# Patient Record
Sex: Female | Born: 1950 | Race: White | Hispanic: No | Smoking: Never smoker
Health system: Southern US, Community
[De-identification: ages and names within clinical notes are randomized; demographics above are authoritative.]

## PROBLEM LIST (undated history)

## (undated) DIAGNOSIS — M1990 Unspecified osteoarthritis, unspecified site: Secondary | ICD-10-CM

## (undated) DIAGNOSIS — R569 Unspecified convulsions: Secondary | ICD-10-CM

## (undated) DIAGNOSIS — Z923 Personal history of irradiation: Secondary | ICD-10-CM

## (undated) DIAGNOSIS — Z8042 Family history of malignant neoplasm of prostate: Secondary | ICD-10-CM

## (undated) DIAGNOSIS — C801 Malignant (primary) neoplasm, unspecified: Secondary | ICD-10-CM

## (undated) DIAGNOSIS — Z800 Family history of malignant neoplasm of digestive organs: Secondary | ICD-10-CM

## (undated) DIAGNOSIS — H269 Unspecified cataract: Secondary | ICD-10-CM

## (undated) DIAGNOSIS — Z803 Family history of malignant neoplasm of breast: Secondary | ICD-10-CM

## (undated) DIAGNOSIS — Z862 Personal history of diseases of the blood and blood-forming organs and certain disorders involving the immune mechanism: Secondary | ICD-10-CM

## (undated) DIAGNOSIS — C50919 Malignant neoplasm of unspecified site of unspecified female breast: Secondary | ICD-10-CM

## (undated) DIAGNOSIS — D649 Anemia, unspecified: Secondary | ICD-10-CM

## (undated) DIAGNOSIS — E7800 Pure hypercholesterolemia, unspecified: Secondary | ICD-10-CM

## (undated) HISTORY — DX: Pure hypercholesterolemia, unspecified: E78.00

## (undated) HISTORY — DX: Unspecified convulsions: R56.9

## (undated) HISTORY — DX: Family history of malignant neoplasm of digestive organs: Z80.0

## (undated) HISTORY — DX: Family history of malignant neoplasm of breast: Z80.3

## (undated) HISTORY — DX: Personal history of diseases of the blood and blood-forming organs and certain disorders involving the immune mechanism: Z86.2

## (undated) HISTORY — DX: Unspecified osteoarthritis, unspecified site: M19.90

## (undated) HISTORY — DX: Family history of malignant neoplasm of prostate: Z80.42

## (undated) HISTORY — DX: Unspecified cataract: H26.9

## (undated) HISTORY — DX: Anemia, unspecified: D64.9

## (undated) HISTORY — PX: BREAST BIOPSY: SHX20

## (undated) HISTORY — PX: OTHER SURGICAL HISTORY: SHX169

---
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
## 2003-02-28 ENCOUNTER — Ambulatory Visit (HOSPITAL_COMMUNITY): Admission: RE | Admit: 2003-02-28 | Discharge: 2003-02-28 | Payer: Self-pay | Admitting: Family Medicine

## 2006-09-09 ENCOUNTER — Emergency Department (HOSPITAL_COMMUNITY): Admission: EM | Admit: 2006-09-09 | Discharge: 2006-09-09 | Payer: Self-pay | Admitting: Family Medicine

## 2006-10-20 ENCOUNTER — Ambulatory Visit: Payer: Self-pay | Admitting: Gastroenterology

## 2006-11-03 ENCOUNTER — Encounter: Payer: Self-pay | Admitting: Gastroenterology

## 2006-11-03 ENCOUNTER — Ambulatory Visit: Payer: Self-pay | Admitting: Gastroenterology

## 2007-02-16 ENCOUNTER — Other Ambulatory Visit: Admission: RE | Admit: 2007-02-16 | Discharge: 2007-02-16 | Payer: Self-pay | Admitting: Obstetrics and Gynecology

## 2008-02-22 ENCOUNTER — Other Ambulatory Visit: Admission: RE | Admit: 2008-02-22 | Discharge: 2008-02-22 | Payer: Self-pay | Admitting: Obstetrics and Gynecology

## 2011-12-13 ENCOUNTER — Encounter: Payer: Self-pay | Admitting: Gastroenterology

## 2012-01-27 ENCOUNTER — Encounter: Payer: Self-pay | Admitting: Gastroenterology

## 2012-01-27 ENCOUNTER — Ambulatory Visit (AMBULATORY_SURGERY_CENTER): Payer: 59

## 2012-01-27 VITALS — Ht 68.0 in | Wt 167.2 lb

## 2012-01-27 DIAGNOSIS — Z1211 Encounter for screening for malignant neoplasm of colon: Secondary | ICD-10-CM

## 2012-01-27 DIAGNOSIS — Z860100 Personal history of colon polyps, unspecified: Secondary | ICD-10-CM

## 2012-02-10 ENCOUNTER — Encounter: Payer: Self-pay | Admitting: Gastroenterology

## 2012-02-10 ENCOUNTER — Ambulatory Visit (AMBULATORY_SURGERY_CENTER): Payer: 59 | Admitting: Gastroenterology

## 2012-02-10 VITALS — BP 121/74 | HR 57 | Temp 97.1°F | Resp 11 | Ht 68.0 in | Wt 167.0 lb

## 2012-02-10 DIAGNOSIS — D126 Benign neoplasm of colon, unspecified: Secondary | ICD-10-CM

## 2012-02-10 DIAGNOSIS — Z86010 Personal history of colonic polyps: Secondary | ICD-10-CM

## 2012-02-10 DIAGNOSIS — Z1211 Encounter for screening for malignant neoplasm of colon: Secondary | ICD-10-CM

## 2012-02-10 MED ORDER — SODIUM CHLORIDE 0.9 % IV SOLN: 500.0000 mL | INTRAVENOUS | Status: DC

## 2012-02-10 NOTE — Progress Notes (Signed)
Patient did not experience any of the following events: a burn prior to discharge; a fall within the facility; wrong site/side/patient/procedure/implant event; or a hospital transfer or hospital admission upon discharge from the facility. (G8907) Patient did not have preoperative order for IV antibiotic SSI prophylaxis. (G8918)  

## 2012-02-10 NOTE — Op Note (Signed)
[facility] [address] [place], [address]   COLONOSCOPY PROCEDURE REPORT  PATIENT: Tiffany Dillon, Tiffany Dillon  MR#: [id number] BIRTHDATE: 08/31/1950 , 61  yrs. old GENDER: Female ENDOSCOPIST: Louis Meckel, MD REFERRED AO:[id number] Modesto Charon, M.D. PROCEDURE DATE:  02/10/2012 PROCEDURE:   Colonoscopy with snare polypectomy ASA CLASS:   Class I INDICATIONS:Screening; index polypectomy 2008 MEDICATIONS: MAC sedation, administered by CRNA and propofol (Diprivan) 250mg  IV  DESCRIPTION OF PROCEDURE:   After the risks benefits and alternatives of the procedure were thoroughly explained, informed consent was obtained.  A digital rectal exam revealed no abnormalities of the rectum.   The LB [id number]  endoscope was introduced through the anus and advanced to the cecum, which was identified by both the appendix and ileocecal valve. No adverse events experienced.   The quality of the prep was Suprep good  The instrument was then slowly withdrawn as the colon was fully examined.      COLON FINDINGS: A sessile polyp measuring 8 mm in size was found in the ascending colon.  A polypectomy was performed.  A polypectomy was performed with a cold snare.  The resection was complete and the polyp tissue was completely retrieved.   The colon mucosa was otherwise normal.  Retroflexed views revealed no abnormalities. The time to cecum=7 minutes 29 seconds.  Withdrawal time=14 minutes 53 seconds.  The scope was withdrawn and the procedure completed. COMPLICATIONS: There were no complications.  ENDOSCOPIC IMPRESSION: 1.   Sessile polyp measuring 8 mm in size was found in the ascending colon; polypectomy was performed; polypectomy was performed with a cold snare 2.   The colon mucosa was otherwise normal  RECOMMENDATIONS: If the polyp(s) removed today are proven to be adenomatous (pre-cancerous) polyps, you will need a repeat colonoscopy in 5 years.  Otherwise you should  continue to follow colorectal cancer screening guidelines for "routine risk" patients with colonoscopy in 10 years.  You will receive a letter within 1-2 weeks with the results of your biopsy as well as final recommendations.  Please call my office if you have not received a letter after 3 weeks.   eSigned:  Louis Meckel, MD 02/10/2012 9:10 AM   cc:

## 2012-02-10 NOTE — Patient Instructions (Addendum)
Discharge instructions given with verbal understanding. Handout on polyps given. Resume previous medications. YOU HAD AN ENDOSCOPIC PROCEDURE TODAY AT THE [facility]: Refer to the procedure report that was given to you for any specific questions about what was found during the examination.  If the procedure report does not answer your questions, please call your gastroenterologist to clarify.  If you requested that your care partner not be given the details of your procedure findings, then the procedure report has been included in a sealed envelope for you to review at your convenience later.  YOU SHOULD EXPECT: Some feelings of bloating in the abdomen. Passage of more gas than usual.  Walking can help get rid of the air that was put into your GI tract during the procedure and reduce the bloating. If you had a lower endoscopy (such as a colonoscopy or flexible sigmoidoscopy) you may notice spotting of blood in your stool or on the toilet paper. If you underwent a bowel prep for your procedure, then you may not have a normal bowel movement for a few days.  DIET: Your first meal following the procedure should be a light meal and then it is ok to progress to your normal diet.  A half-sandwich or bowl of soup is an example of a good first meal.  Heavy or fried foods are harder to digest and may make you feel nauseous or bloated.  Likewise meals heavy in dairy and vegetables can cause extra gas to form and this can also increase the bloating.  Drink plenty of fluids but you should avoid alcoholic beverages for 24 hours.  ACTIVITY: Your care partner should take you home directly after the procedure.  You should plan to take it easy, moving slowly for the rest of the day.  You can resume normal activity the day after the procedure however you should NOT DRIVE or use heavy machinery for 24 hours (because of the sedation medicines used during the test).    SYMPTOMS TO REPORT IMMEDIATELY: A  gastroenterologist can be reached at any hour.  During normal business hours, 8:30 AM to 5:00 PM Monday through Friday, call [phone].  After hours and on weekends, please call the GI answering service at [phone] who will take a message and have the physician on call contact you.   Following lower endoscopy (colonoscopy or flexible sigmoidoscopy):  Excessive amounts of blood in the stool  Significant tenderness or worsening of abdominal pains  Swelling of the abdomen that is new, acute  Fever of 100F or higher  FOLLOW UP: If any biopsies were taken you will be contacted by phone or by letter within the next 1-3 weeks.  Call your gastroenterologist if you have not heard about the biopsies in 3 weeks.  Our staff will call the home number listed on your records the next business day following your procedure to check on you and address any questions or concerns that you may have at that time regarding the information given to you following your procedure. This is a courtesy call and so if there is no answer at the home number and we have not heard from you through the emergency physician on call, we will assume that you have returned to your regular daily activities without incident.  SIGNATURES/CONFIDENTIALITY: You and/or your care partner have signed paperwork which will be entered into your electronic medical record.  These signatures attest to the fact that that the information above on your After Visit Summary has   been reviewed and is understood.  Full responsibility of the confidentiality of this discharge information lies with you and/or your care-partner. 

## 2012-02-10 NOTE — Progress Notes (Signed)
Propofol per Rosemarie Beath CRNa . See scanned intra procedure report. ewm

## 2012-02-13 ENCOUNTER — Telehealth: Payer: Self-pay | Admitting: *Deleted

## 2012-02-13 NOTE — Telephone Encounter (Signed)
  Follow up Call-  Call back number 02/10/2012  Post procedure Call Back phone  # [phone]  Permission to leave phone message Yes     Patient questions:  Do you have a fever, pain , or abdominal swelling? no Pain Score  0 *  Have you tolerated food without any problems? yes  Have you been able to return to your normal activities? yes  Do you have any questions about your discharge instructions: Diet   no Medications  no Follow up visit  no  Do you have questions or concerns about your Care? no  Actions: * If pain score is 4 or above: No action needed, pain <4.

## 2012-02-20 ENCOUNTER — Encounter: Payer: Self-pay | Admitting: Gastroenterology

## 2012-08-27 ENCOUNTER — Telehealth: Payer: Self-pay | Admitting: Obstetrics and Gynecology

## 2012-08-27 NOTE — Telephone Encounter (Signed)
Pt is calling because she is currently using Vagifem. Her insurance company has increased the cost for this prescription. She can no longer afford to pay the new cost. She was wondering if there is a cheaper alternative. Pt said she has been researching progesterone and wondered if this could be an option. She was also wondering if she needs to taper off the medication or what exactly she should do. Please advise.

## 2012-08-28 NOTE — Telephone Encounter (Signed)
Ask her to price estrace cream and prenarin cream with her new insurance and see if those are affordable. The progesterone doesn't really do the same thing and is not a good option.

## 2012-08-28 NOTE — Telephone Encounter (Signed)
Patient notified of no vagifem coupons available only samples. Patient only wanted coupon. Patient given response from Dr. Tresa Res of may use estrace or premarin cream to check with her insurance on this. Patient will call back if need any more help with this.

## 2012-08-28 NOTE — Telephone Encounter (Signed)
Patient is asking for Vagifem coupons

## 2013-01-17 ENCOUNTER — Encounter: Payer: Self-pay | Admitting: Obstetrics & Gynecology

## 2013-04-03 ENCOUNTER — Encounter: Payer: Self-pay | Admitting: Obstetrics & Gynecology

## 2013-05-23 ENCOUNTER — Encounter: Payer: Self-pay | Admitting: Obstetrics & Gynecology

## 2013-05-24 ENCOUNTER — Encounter: Payer: Self-pay | Admitting: Obstetrics & Gynecology

## 2013-05-24 ENCOUNTER — Ambulatory Visit (INDEPENDENT_AMBULATORY_CARE_PROVIDER_SITE_OTHER): Payer: BC Managed Care – PPO | Admitting: Obstetrics & Gynecology

## 2013-05-24 ENCOUNTER — Ambulatory Visit: Payer: Self-pay | Admitting: Obstetrics and Gynecology

## 2013-05-24 VITALS — BP 126/80 | HR 72 | Resp 18 | Ht 68.0 in | Wt 158.0 lb

## 2013-05-24 DIAGNOSIS — Z0000 Encounter for general adult medical examination without abnormal findings: Secondary | ICD-10-CM

## 2013-05-24 DIAGNOSIS — Z01419 Encounter for gynecological examination (general) (routine) without abnormal findings: Secondary | ICD-10-CM

## 2013-05-24 LAB — COMPREHENSIVE METABOLIC PANEL
ALT: 26 U/L (ref 0–35)
AST: 25 U/L (ref 0–37)
Albumin: 4.4 g/dL (ref 3.5–5.2)
Alkaline Phosphatase: 61 U/L (ref 39–117)
BUN: 18 mg/dL (ref 6–23)
CO2: 29 mEq/L (ref 19–32)
Calcium: 9.6 mg/dL (ref 8.4–10.5)
Chloride: 106 mEq/L (ref 96–112)
Creat: 0.9 mg/dL (ref 0.50–1.10)
Glucose, Bld: 92 mg/dL (ref 70–99)
Potassium: 5.2 mEq/L (ref 3.5–5.3)
Sodium: 141 mEq/L (ref 135–145)
Total Bilirubin: 0.8 mg/dL (ref 0.2–1.2)
Total Protein: 6.9 g/dL (ref 6.0–8.3)

## 2013-05-24 LAB — LIPID PANEL
Cholesterol: 206 mg/dL — ABNORMAL HIGH (ref 0–200)
HDL: 69 mg/dL (ref >39)
LDL Cholesterol: 121 mg/dL — ABNORMAL HIGH (ref 0–99)
Total CHOL/HDL Ratio: 3 Ratio
Triglycerides: 79 mg/dL (ref <150)
VLDL: 16 mg/dL (ref 0–40)

## 2013-05-24 LAB — TSH: TSH: 3.368 u[IU]/mL (ref 0.350–4.500)

## 2013-05-24 LAB — HEMOGLOBIN, FINGERSTICK: Hemoglobin, fingerstick: 15.7 g/dL (ref 12.0–16.0)
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
## 2013-05-24 NOTE — Addendum Note (Signed)
Addended by: Robley Fries on: 05/24/2013 01:19 PM   Modules accepted: Orders

## 2013-05-24 NOTE — Patient Instructions (Signed)

## 2013-05-25 LAB — VITAMIN D 25 HYDROXY (VIT D DEFICIENCY, FRACTURES): Vit D, 25-Hydroxy: 49 ng/mL (ref 30–89)

## 2013-06-21 ENCOUNTER — Telehealth: Payer: Self-pay | Admitting: Obstetrics & Gynecology

## 2013-06-21 NOTE — Telephone Encounter (Signed)
Patient calling stating she is returning [facility] call. No phone note entered and patient was not sure of why she was called. Per protocol, routing to triage.

## 2013-06-26 NOTE — Telephone Encounter (Signed)
Tiffany Dillon and Gay Filler both asked me about her. I do not remember calling her and cannot find any note re: any results? Sorry.

## 2013-06-27 NOTE — Telephone Encounter (Signed)
Spoke with husband, he states that it was an old message that was left on the answering machine. He states they figured that out after calling. She will call if needs anything further.   Routing to provider for final review. Patient agreeable to disposition. Will close encounter

## 2013-07-05 ENCOUNTER — Other Ambulatory Visit: Payer: Self-pay | Admitting: Dermatology

## 2013-09-02 ENCOUNTER — Ambulatory Visit (INDEPENDENT_AMBULATORY_CARE_PROVIDER_SITE_OTHER): Payer: BC Managed Care – PPO | Admitting: Family Medicine

## 2013-09-02 VITALS — BP 136/74 | HR 81 | Temp 96.4°F | Ht 68.0 in | Wt 158.6 lb

## 2013-09-02 DIAGNOSIS — N390 Urinary tract infection, site not specified: Secondary | ICD-10-CM

## 2013-09-02 DIAGNOSIS — R309 Painful micturition, unspecified: Secondary | ICD-10-CM

## 2013-09-02 DIAGNOSIS — R300 Dysuria: Secondary | ICD-10-CM

## 2013-09-02 LAB — POCT URINALYSIS DIPSTICK
Bilirubin, UA: NEGATIVE
Glucose, UA: NEGATIVE
Ketones, UA: NEGATIVE
Nitrite, UA: POSITIVE
Protein, UA: NEGATIVE
Spec Grav, UA: 1.01
Urobilinogen, UA: NEGATIVE
pH, UA: 7

## 2013-09-02 LAB — POCT UA - MICROSCOPIC ONLY
Casts, Ur, LPF, POC: NEGATIVE
Crystals, Ur, HPF, POC: NEGATIVE
Mucus, UA: NEGATIVE
Yeast, UA: NEGATIVE

## 2013-09-02 MED ORDER — CIPROFLOXACIN HCL 500 MG PO TABS: 500.0000 mg | ORAL_TABLET | Freq: Two times a day (BID) | ORAL | Status: DC

## 2013-09-02 NOTE — Progress Notes (Signed)
   Subjective:    Patient ID: Tiffany Dillon, female    DOB: 1951/01/21, 63 y.o.   MRN: [id number]  HPI This 63 y.o. female presents for evaluation of dysuria.   Review of Systems    No chest pain, SOB, HA, dizziness, vision change, N/V, diarrhea, constipation, myalgias, arthralgias or rash.  Objective:   Physical Exam  Vital signs noted  Well developed well nourished female.  HEENT - Head atraumatic Normocephalic                Eyes - PERRLA, Conjuctiva - clear Sclera- Clear EOMI                Ears - EAC's Wnl TM's Wnl Gross Hearing WNL                Throat - oropharanx wnl Respiratory - Lungs CTA bilateral Cardiac - RRR S1 and S2 without murmur GI - Abdomen soft Nontender and bowel sounds active x 4 Extremities - No edema. Neuro - Grossly intact.      Assessment & Plan:  Painful urination - Plan: POCT UA - Microscopic Only, POCT urinalysis dipstick, ciprofloxacin (CIPRO) 500 MG tablet, Urine culture  UTI (lower urinary tract infection) - Plan: ciprofloxacin (CIPRO) 500 MG tablet, Urine culture  Lysbeth Penner FNP

## 2013-09-03 ENCOUNTER — Other Ambulatory Visit: Payer: Self-pay | Admitting: Family Medicine

## 2013-09-04 LAB — URINE CULTURE

## 2013-09-17 ENCOUNTER — Other Ambulatory Visit (INDEPENDENT_AMBULATORY_CARE_PROVIDER_SITE_OTHER): Payer: BC Managed Care – PPO

## 2013-09-17 DIAGNOSIS — N390 Urinary tract infection, site not specified: Secondary | ICD-10-CM

## 2013-09-17 LAB — POCT UA - MICROSCOPIC ONLY
Bacteria, U Microscopic: NEGATIVE
Casts, Ur, LPF, POC: NEGATIVE
Crystals, Ur, HPF, POC: NEGATIVE
Mucus, UA: NEGATIVE
RBC, urine, microscopic: NEGATIVE
WBC, Ur, HPF, POC: NEGATIVE
Yeast, UA: NEGATIVE

## 2013-09-17 LAB — POCT URINALYSIS DIPSTICK
Bilirubin, UA: NEGATIVE
Blood, UA: NEGATIVE
Glucose, UA: NEGATIVE
Ketones, UA: NEGATIVE
Leukocytes, UA: NEGATIVE
Nitrite, UA: NEGATIVE
Protein, UA: NEGATIVE
Spec Grav, UA: 1.01
Urobilinogen, UA: NEGATIVE
pH, UA: 7

## 2013-09-23 ENCOUNTER — Encounter: Payer: Self-pay | Admitting: Family Medicine

## 2013-09-23 ENCOUNTER — Other Ambulatory Visit: Payer: Self-pay | Admitting: Family Medicine

## 2014-01-30 ENCOUNTER — Ambulatory Visit (INDEPENDENT_AMBULATORY_CARE_PROVIDER_SITE_OTHER): Payer: BC Managed Care – PPO

## 2014-01-30 DIAGNOSIS — Z23 Encounter for immunization: Secondary | ICD-10-CM

## 2014-02-24 ENCOUNTER — Encounter: Payer: Self-pay | Admitting: Obstetrics & Gynecology

## 2014-06-13 ENCOUNTER — Ambulatory Visit (INDEPENDENT_AMBULATORY_CARE_PROVIDER_SITE_OTHER): Payer: BLUE CROSS/BLUE SHIELD | Admitting: Obstetrics & Gynecology

## 2014-06-13 ENCOUNTER — Encounter: Payer: Self-pay | Admitting: Obstetrics & Gynecology

## 2014-06-13 VITALS — BP 110/76 | HR 80 | Ht 68.25 in | Wt 154.6 lb

## 2014-06-13 DIAGNOSIS — Z01419 Encounter for gynecological examination (general) (routine) without abnormal findings: Secondary | ICD-10-CM

## 2014-06-13 DIAGNOSIS — Z0000 Encounter for general adult medical examination without abnormal findings: Secondary | ICD-10-CM

## 2014-06-13 DIAGNOSIS — Z124 Encounter for screening for malignant neoplasm of cervix: Secondary | ICD-10-CM

## 2014-06-13 LAB — POCT URINALYSIS DIPSTICK
LEUKOCYTES UA: NEGATIVE
UROBILINOGEN UA: NEGATIVE
pH, UA: 5

## 2014-06-13 LAB — COMPREHENSIVE METABOLIC PANEL
ALBUMIN: 4.3 g/dL (ref 3.5–5.2)
ALT: 26 U/L (ref 0–35)
AST: 28 U/L (ref 0–37)
Alkaline Phosphatase: 59 U/L (ref 39–117)
BUN: 17 mg/dL (ref 6–23)
CO2: 27 mEq/L (ref 19–32)
Calcium: 9.4 mg/dL (ref 8.4–10.5)
Chloride: 105 mEq/L (ref 96–112)
Creat: 0.83 mg/dL (ref 0.50–1.10)
GLUCOSE: 86 mg/dL (ref 70–99)
Potassium: 4.9 mEq/L (ref 3.5–5.3)
SODIUM: 142 meq/L (ref 135–145)
TOTAL PROTEIN: 6.6 g/dL (ref 6.0–8.3)
Total Bilirubin: 0.6 mg/dL (ref 0.2–1.2)

## 2014-06-13 LAB — LIPID PANEL
CHOL/HDL RATIO: 2.7 ratio
Cholesterol: 188 mg/dL (ref 0–200)
HDL: 70 mg/dL (ref >39)
LDL CALC: 104 mg/dL — AB (ref 0–99)
Triglycerides: 71 mg/dL (ref <150)
VLDL: 14 mg/dL (ref 0–40)

## 2014-06-13 NOTE — Progress Notes (Signed)
Scheduled patient for BMD for 07/03/14 at 3:00 pm, patient is aware. Information with appointment time given to patient.
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
## 2014-06-13 NOTE — Addendum Note (Signed)
Addended by: Alfonzo Feller on: 06/13/2014 10:05 AM   Modules accepted: SmartSet

## 2014-06-14 LAB — TSH: TSH: 3.461 u[IU]/mL (ref 0.350–4.500)

## 2014-06-14 LAB — VITAMIN D 25 HYDROXY (VIT D DEFICIENCY, FRACTURES): VIT D 25 HYDROXY: 39 ng/mL (ref 30–100)

## 2014-06-16 LAB — IPS PAP TEST WITH REFLEX TO HPV

## 2014-06-16 LAB — HEMOGLOBIN, FINGERSTICK: Hemoglobin, fingerstick: 14.2 g/dL (ref 12.0–16.0)

## 2014-07-15 ENCOUNTER — Encounter: Payer: Self-pay | Admitting: Obstetrics & Gynecology

## 2014-07-15 NOTE — Telephone Encounter (Signed)
Called patient at home, no answer. My chart message sent with results.

## 2014-07-16 NOTE — Telephone Encounter (Signed)
Patient has read mychart message. Will close encounter.

## 2014-10-20 ENCOUNTER — Other Ambulatory Visit: Payer: Self-pay

## 2015-02-03 ENCOUNTER — Ambulatory Visit (INDEPENDENT_AMBULATORY_CARE_PROVIDER_SITE_OTHER): Payer: Medicare HMO

## 2015-02-03 DIAGNOSIS — Z23 Encounter for immunization: Secondary | ICD-10-CM | POA: Diagnosis not present

## 2015-02-03 NOTE — Addendum Note (Signed)
Addended by: Thana Ates on: 02/03/2015 02:19 PM   Modules accepted: Orders

## 2015-02-05 DIAGNOSIS — R69 Illness, unspecified: Secondary | ICD-10-CM | POA: Diagnosis not present

## 2015-02-06 DIAGNOSIS — L738 Other specified follicular disorders: Secondary | ICD-10-CM | POA: Diagnosis not present

## 2015-02-06 DIAGNOSIS — D225 Melanocytic nevi of trunk: Secondary | ICD-10-CM | POA: Diagnosis not present

## 2015-02-06 DIAGNOSIS — D2272 Melanocytic nevi of left lower limb, including hip: Secondary | ICD-10-CM | POA: Diagnosis not present

## 2015-02-06 DIAGNOSIS — D2261 Melanocytic nevi of right upper limb, including shoulder: Secondary | ICD-10-CM | POA: Diagnosis not present

## 2015-02-06 DIAGNOSIS — L821 Other seborrheic keratosis: Secondary | ICD-10-CM | POA: Diagnosis not present

## 2015-02-06 DIAGNOSIS — L280 Lichen simplex chronicus: Secondary | ICD-10-CM | POA: Diagnosis not present

## 2015-02-06 DIAGNOSIS — L659 Nonscarring hair loss, unspecified: Secondary | ICD-10-CM | POA: Diagnosis not present

## 2015-02-06 DIAGNOSIS — L650 Telogen effluvium: Secondary | ICD-10-CM | POA: Diagnosis not present

## 2015-03-05 DIAGNOSIS — R05 Cough: Secondary | ICD-10-CM | POA: Diagnosis not present

## 2015-04-24 DIAGNOSIS — Z1231 Encounter for screening mammogram for malignant neoplasm of breast: Secondary | ICD-10-CM | POA: Diagnosis not present

## 2015-04-24 DIAGNOSIS — R928 Other abnormal and inconclusive findings on diagnostic imaging of breast: Secondary | ICD-10-CM | POA: Diagnosis not present

## 2015-04-29 ENCOUNTER — Encounter: Payer: Self-pay | Admitting: Family Medicine

## 2015-04-29 ENCOUNTER — Ambulatory Visit (INDEPENDENT_AMBULATORY_CARE_PROVIDER_SITE_OTHER): Payer: Medicare HMO | Admitting: Family Medicine

## 2015-04-29 VITALS — BP 135/73 | HR 77 | Temp 97.1°F | Ht 68.25 in | Wt 155.2 lb

## 2015-04-29 DIAGNOSIS — N3000 Acute cystitis without hematuria: Secondary | ICD-10-CM | POA: Diagnosis not present

## 2015-04-29 DIAGNOSIS — R300 Dysuria: Secondary | ICD-10-CM | POA: Diagnosis not present

## 2015-04-29 LAB — POCT URINALYSIS DIPSTICK
Glucose, UA: NEGATIVE
Nitrite, UA: NEGATIVE
Spec Grav, UA: 1.01
Urobilinogen, UA: NEGATIVE
pH, UA: 7

## 2015-04-29 LAB — POCT UA - MICROSCOPIC ONLY
Casts, Ur, LPF, POC: NEGATIVE
Crystals, Ur, HPF, POC: NEGATIVE
Epithelial cells, urine per micros: NEGATIVE
Mucus, UA: NEGATIVE
Yeast, UA: NEGATIVE

## 2015-04-29 MED ORDER — SULFAMETHOXAZOLE-TRIMETHOPRIM 800-160 MG PO TABS: 1.0000 | ORAL_TABLET | Freq: Two times a day (BID) | ORAL | Status: DC

## 2015-04-29 NOTE — Progress Notes (Signed)
BP 135/73 mmHg  Pulse 77  Temp(Src) 97.1 F (36.2 C) (Oral)  Ht 5' 8.25" (1.734 m)  Wt 155 lb 3.2 oz (70.398 kg)  BMI 23.41 kg/m2  LMP 04/25/1998   Subjective:    Patient ID: Tiffany Dillon, female    DOB: 1950-05-24, 65 y.o.   MRN: [id number]  HPI: Tiffany Dillon is a 65 y.o. female presenting on 04/29/2015 for Urinary Tract Infection   HPI Bladder symptoms Patient has had general discomfort when she urinates but not pain as she describes it. She has not had urinary odor but she has had darkened urine and increased frequency. She denies any hematuria. She denies any fevers or chills or flank pain. She has gotten urinary tract infections in the past about once per year.  Relevant past medical, surgical, family and social history reviewed and updated as indicated. Interim medical history since our last visit reviewed. Allergies and medications reviewed and updated.  Review of Systems  Constitutional: Negative for fever and chills.  HENT: Negative for congestion, ear discharge and ear pain.   Eyes: Negative for redness and visual disturbance.  Respiratory: Negative for chest tightness and shortness of breath.   Cardiovascular: Negative for chest pain and leg swelling.  Gastrointestinal: Negative for nausea, vomiting and abdominal pain.  Genitourinary: Positive for dysuria, urgency and frequency. Negative for hematuria, flank pain, decreased urine volume and difficulty urinating.  Musculoskeletal: Negative for back pain and gait problem.  Skin: Negative for rash.  Neurological: Negative for light-headedness and headaches.  Psychiatric/Behavioral: Negative for behavioral problems and agitation.  All other systems reviewed and are negative.   Per HPI unless specifically indicated above     Medication List       This list is accurate as of: 04/29/15  3:52 PM.  Always use your most recent med list.               aspirin 81 MG tablet  Take 81 mg by mouth daily.     BIOTIN PO  Take 1,500 mcg by mouth.     CALCIUM 600 PO  Take by mouth daily.     cholecalciferol 1000 units tablet  Commonly known as:  VITAMIN D  Take 1,000 Units by mouth daily.     Fish Oil 1000 MG Caps  Take by mouth.     IRON PO  Take 150 mg by mouth daily.     MAGNESIUM PO  Take by mouth daily.     multivitamin tablet  Take 1 tablet by mouth daily.     psyllium 58.6 % powder  Commonly known as:  METAMUCIL  Take 1 packet by mouth daily.     sulfamethoxazole-trimethoprim 800-160 MG tablet  Commonly known as:  BACTRIM DS,SEPTRA DS  Take 1 tablet by mouth 2 (two) times daily.     vitamin C 1000 MG tablet  Take 1,000 mg by mouth daily. Patient takes rx in powder form.           Objective:    BP 135/73 mmHg  Pulse 77  Temp(Src) 97.1 F (36.2 C) (Oral)  Ht 5' 8.25" (1.734 m)  Wt 155 lb 3.2 oz (70.398 kg)  BMI 23.41 kg/m2  LMP 04/25/1998  Wt Readings from Last 3 Encounters:  04/29/15 155 lb 3.2 oz (70.398 kg)  06/13/14 154 lb 9.6 oz (70.126 kg)  09/02/13 158 lb 9.6 oz (71.94 kg)    Physical Exam  Constitutional: She is oriented to person, place, and time. She  appears well-developed and well-nourished. No distress.  Eyes: Conjunctivae and EOM are normal. Pupils are equal, round, and reactive to light.  Cardiovascular: Normal rate, regular rhythm, normal heart sounds and intact distal pulses.   No murmur heard. Pulmonary/Chest: Effort normal and breath sounds normal. No respiratory distress. She has no wheezes.  Abdominal: Soft. Bowel sounds are normal. She exhibits no distension. There is no tenderness. There is no rebound, no guarding and no CVA tenderness.  Musculoskeletal: Normal range of motion. She exhibits no edema or tenderness.  Neurological: She is alert and oriented to person, place, and time. Coordination normal.  Skin: Skin is warm and dry. No rash noted. She is not diaphoretic.  Psychiatric: She has a normal mood and affect. Her behavior is  normal.  Nursing note and vitals reviewed.   Results for orders placed or performed in visit on 04/29/15  POCT urinalysis dipstick  Result Value Ref Range   Color, UA gold    Clarity, UA cloudy    Glucose, UA neg    Bilirubin, UA small    Ketones, UA trace    Spec Grav, UA 1.010    Blood, UA large    pH, UA 7.0    Protein, UA trace    Urobilinogen, UA negative    Nitrite, UA neg    Leukocytes, UA moderate (2+) (A) Negative  POCT UA - Microscopic Only  Result Value Ref Range   WBC, Ur, HPF, POC 40-80    RBC, urine, microscopic 10-15    Bacteria, U Microscopic occ    Mucus, UA neg    Epithelial cells, urine per micros neg    Crystals, Ur, HPF, POC neg    Casts, Ur, LPF, POC neg    Yeast, UA neg       Assessment & Plan:   Problem List Items Addressed This Visit    None    Visit Diagnoses    Dysuria    -  Primary    Relevant Medications    sulfamethoxazole-trimethoprim (BACTRIM DS,SEPTRA DS) 800-160 MG tablet    Other Relevant Orders    POCT urinalysis dipstick (Completed)    POCT UA - Microscopic Only (Completed)    Urine culture    Acute cystitis without hematuria        Relevant Medications    sulfamethoxazole-trimethoprim (BACTRIM DS,SEPTRA DS) 800-160 MG tablet    Other Relevant Orders    Urine culture        Follow up plan: Return if symptoms worsen or fail to improve.  Counseling provided for all of the vaccine components Orders Placed This Encounter  Procedures  . Urine culture  . POCT urinalysis dipstick  . POCT UA - Microscopic Only    Caryl Pina, MD [facility] 04/29/2015, 3:52 PM

## 2015-05-01 ENCOUNTER — Telehealth: Payer: Self-pay | Admitting: Family Medicine

## 2015-05-01 LAB — URINE CULTURE

## 2015-05-01 MED ORDER — NITROFURANTOIN MONOHYD MACRO 100 MG PO CAPS: 100.0000 mg | ORAL_CAPSULE | Freq: Two times a day (BID) | ORAL | Status: DC

## 2015-05-01 NOTE — Telephone Encounter (Signed)
Sent macrobid

## 2015-05-01 NOTE — Telephone Encounter (Signed)
Please send Keflex 500 twice a day for 10 days

## 2015-05-01 NOTE — Telephone Encounter (Signed)
Keflex is not on her formulary.

## 2015-05-01 NOTE — Telephone Encounter (Signed)
Patient aware of medication changes

## 2015-05-01 NOTE — Telephone Encounter (Signed)
Patient is on Bactrim and she noticed a rash this morning. She started on Wednesday with the Bactrim. Patient advised to stop Bactrim.

## 2015-05-01 NOTE — Addendum Note (Signed)
Addended by: Thana Ates on: 05/01/2015 11:12 AM   Modules accepted: Orders

## 2015-05-06 DIAGNOSIS — R928 Other abnormal and inconclusive findings on diagnostic imaging of breast: Secondary | ICD-10-CM | POA: Diagnosis not present

## 2015-05-15 DIAGNOSIS — H25012 Cortical age-related cataract, left eye: Secondary | ICD-10-CM | POA: Diagnosis not present

## 2015-05-15 DIAGNOSIS — H2513 Age-related nuclear cataract, bilateral: Secondary | ICD-10-CM | POA: Diagnosis not present

## 2015-05-15 DIAGNOSIS — H43813 Vitreous degeneration, bilateral: Secondary | ICD-10-CM | POA: Diagnosis not present

## 2015-05-15 DIAGNOSIS — H524 Presbyopia: Secondary | ICD-10-CM | POA: Diagnosis not present

## 2015-08-03 ENCOUNTER — Ambulatory Visit (INDEPENDENT_AMBULATORY_CARE_PROVIDER_SITE_OTHER): Payer: Medicare HMO | Admitting: Family Medicine

## 2015-08-03 ENCOUNTER — Encounter: Payer: Self-pay | Admitting: Family Medicine

## 2015-08-03 VITALS — BP 115/68 | HR 92 | Temp 97.8°F | Ht 68.25 in | Wt 154.4 lb

## 2015-08-03 DIAGNOSIS — R350 Frequency of micturition: Secondary | ICD-10-CM

## 2015-08-03 DIAGNOSIS — N3090 Cystitis, unspecified without hematuria: Secondary | ICD-10-CM

## 2015-08-03 LAB — URINALYSIS, COMPLETE
Bilirubin, UA: NEGATIVE
GLUCOSE, UA: NEGATIVE
NITRITE UA: POSITIVE — AB
PH UA: 5.5 (ref 5.0–7.5)
Specific Gravity, UA: 1.015 (ref 1.005–1.030)
UUROB: 0.2 mg/dL (ref 0.2–1.0)

## 2015-08-03 LAB — MICROSCOPIC EXAMINATION

## 2015-08-03 MED ORDER — NITROFURANTOIN MONOHYD MACRO 100 MG PO CAPS: 100.0000 mg | ORAL_CAPSULE | Freq: Two times a day (BID) | ORAL | Status: DC
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
## 2015-08-24 DIAGNOSIS — D225 Melanocytic nevi of trunk: Secondary | ICD-10-CM | POA: Diagnosis not present

## 2015-08-24 DIAGNOSIS — D1801 Hemangioma of skin and subcutaneous tissue: Secondary | ICD-10-CM | POA: Diagnosis not present

## 2015-08-24 DIAGNOSIS — L821 Other seborrheic keratosis: Secondary | ICD-10-CM | POA: Diagnosis not present

## 2015-08-24 DIAGNOSIS — D2262 Melanocytic nevi of left upper limb, including shoulder: Secondary | ICD-10-CM | POA: Diagnosis not present

## 2015-08-24 DIAGNOSIS — D2272 Melanocytic nevi of left lower limb, including hip: Secondary | ICD-10-CM | POA: Diagnosis not present

## 2015-08-24 DIAGNOSIS — L820 Inflamed seborrheic keratosis: Secondary | ICD-10-CM | POA: Diagnosis not present

## 2015-08-24 DIAGNOSIS — L650 Telogen effluvium: Secondary | ICD-10-CM | POA: Diagnosis not present

## 2015-08-24 DIAGNOSIS — L603 Nail dystrophy: Secondary | ICD-10-CM | POA: Diagnosis not present

## 2015-08-24 DIAGNOSIS — L819 Disorder of pigmentation, unspecified: Secondary | ICD-10-CM | POA: Diagnosis not present

## 2015-08-24 DIAGNOSIS — D2271 Melanocytic nevi of right lower limb, including hip: Secondary | ICD-10-CM | POA: Diagnosis not present

## 2015-09-04 ENCOUNTER — Ambulatory Visit (INDEPENDENT_AMBULATORY_CARE_PROVIDER_SITE_OTHER): Payer: Medicare HMO | Admitting: Obstetrics & Gynecology

## 2015-09-04 ENCOUNTER — Encounter: Payer: Self-pay | Admitting: Obstetrics & Gynecology

## 2015-09-04 VITALS — BP 128/76 | HR 68 | Resp 16 | Ht 68.0 in | Wt 156.0 lb

## 2015-09-04 DIAGNOSIS — Z0000 Encounter for general adult medical examination without abnormal findings: Secondary | ICD-10-CM | POA: Diagnosis not present

## 2015-09-04 DIAGNOSIS — E785 Hyperlipidemia, unspecified: Secondary | ICD-10-CM | POA: Diagnosis not present

## 2015-09-04 DIAGNOSIS — Z01419 Encounter for gynecological examination (general) (routine) without abnormal findings: Secondary | ICD-10-CM | POA: Diagnosis not present

## 2015-09-04 LAB — COMPREHENSIVE METABOLIC PANEL
ALBUMIN: 4.4 g/dL (ref 3.6–5.1)
ALK PHOS: 55 U/L (ref 33–130)
ALT: 28 U/L (ref 6–29)
AST: 28 U/L (ref 10–35)
BILIRUBIN TOTAL: 0.7 mg/dL (ref 0.2–1.2)
BUN: 18 mg/dL (ref 7–25)
CALCIUM: 9.6 mg/dL (ref 8.6–10.4)
CO2: 30 mmol/L (ref 20–31)
Chloride: 103 mmol/L (ref 98–110)
Creat: 0.83 mg/dL (ref 0.50–0.99)
Glucose, Bld: 91 mg/dL (ref 65–99)
Potassium: 5.2 mmol/L (ref 3.5–5.3)
Sodium: 140 mmol/L (ref 135–146)
Total Protein: 6.8 g/dL (ref 6.1–8.1)

## 2015-09-04 LAB — CBC
HEMATOCRIT: 45.5 % — AB (ref 35.0–45.0)
HEMOGLOBIN: 15.2 g/dL (ref 11.7–15.5)
MCH: 31.1 pg (ref 27.0–33.0)
MCHC: 33.4 g/dL (ref 32.0–36.0)
MCV: 93 fL (ref 80.0–100.0)
MPV: 10.1 fL (ref 7.5–12.5)
Platelets: 196 10*3/uL (ref 140–400)
RBC: 4.89 MIL/uL (ref 3.80–5.10)
RDW: 13.2 % (ref 11.0–15.0)
WBC: 4.5 10*3/uL (ref 3.8–10.8)

## 2015-09-04 LAB — POCT URINALYSIS DIPSTICK
BILIRUBIN UA: NEGATIVE
Blood, UA: NEGATIVE
GLUCOSE UA: NEGATIVE
Ketones, UA: NEGATIVE
NITRITE UA: NEGATIVE
Protein, UA: NEGATIVE
Urobilinogen, UA: NEGATIVE
pH, UA: 8

## 2015-09-04 LAB — LIPID PANEL
Cholesterol: 218 mg/dL — ABNORMAL HIGH (ref 125–200)
HDL: 81 mg/dL (ref >=46)
LDL Cholesterol: 120 mg/dL (ref <130)
TRIGLYCERIDES: 86 mg/dL (ref <150)
Total CHOL/HDL Ratio: 2.7 Ratio (ref <=5.0)
VLDL: 17 mg/dL (ref <30)
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
## 2015-09-10 DIAGNOSIS — R69 Illness, unspecified: Secondary | ICD-10-CM | POA: Diagnosis not present

## 2016-01-18 ENCOUNTER — Ambulatory Visit: Payer: Medicare HMO

## 2016-01-18 ENCOUNTER — Other Ambulatory Visit: Payer: Self-pay | Admitting: *Deleted

## 2016-01-18 ENCOUNTER — Ambulatory Visit (INDEPENDENT_AMBULATORY_CARE_PROVIDER_SITE_OTHER): Payer: Medicare HMO | Admitting: Nurse Practitioner

## 2016-01-18 ENCOUNTER — Encounter: Payer: Self-pay | Admitting: Nurse Practitioner

## 2016-01-18 DIAGNOSIS — R350 Frequency of micturition: Secondary | ICD-10-CM | POA: Diagnosis not present

## 2016-01-18 DIAGNOSIS — Z23 Encounter for immunization: Secondary | ICD-10-CM | POA: Diagnosis not present

## 2016-01-18 DIAGNOSIS — R3915 Urgency of urination: Secondary | ICD-10-CM

## 2016-01-18 LAB — URINALYSIS, COMPLETE
Bilirubin, UA: NEGATIVE
Glucose, UA: NEGATIVE
Ketones, UA: NEGATIVE
Leukocytes, UA: NEGATIVE
Nitrite, UA: NEGATIVE
PH UA: 6 (ref 5.0–7.5)
PROTEIN UA: NEGATIVE
Specific Gravity, UA: <1.005 — ABNORMAL LOW (ref 1.005–1.030)
UUROB: 0.2 mg/dL (ref 0.2–1.0)

## 2016-01-18 LAB — MICROSCOPIC EXAMINATION: BACTERIA UA: NONE SEEN

## 2016-01-18 NOTE — Progress Notes (Signed)
Pt with sxs of urinary frequency Has appt with MMM at 5:00 Order entered in epic for UA

## 2016-01-18 NOTE — Progress Notes (Signed)
Subjective:    Tiffany Dillon is a 65 y.o. female who complains of frequency and incontinence. She has had symptoms for 1 week. Patient also complains of nothing. Patient denies back pain, fever and vaginal discharge. Patient does have a history of recurrent UTI. Patient does not have a history of pyelonephritis.   The following portions of the patient's history were reviewed and updated as appropriate: past family history, past social history, past surgical history and problem list.  Review of Systems Constitutional: negative Respiratory: negative Cardiovascular: negative Gastrointestinal: negative Genitourinary:negative    Objective:    positive  for urinary frequenct and stress incontinience. Negative CVA tenderness.  Laboratory:  Urine dipstick: Negative, trace for hemoglobin.   Micro exam: not done.    Assessment:    hematuria     Plan:    Maintain adequate hydration.   Repeat lab in 2 weeks  RTO if symptoms worsens or does not improve Mary-Margaret Hassell Done, FNP

## 2016-01-18 NOTE — Patient Instructions (Signed)

## 2016-02-01 ENCOUNTER — Ambulatory Visit (INDEPENDENT_AMBULATORY_CARE_PROVIDER_SITE_OTHER): Payer: Medicare HMO | Admitting: Family Medicine

## 2016-02-01 ENCOUNTER — Encounter: Payer: Self-pay | Admitting: Family Medicine

## 2016-02-01 VITALS — BP 132/73 | HR 77 | Temp 97.4°F | Ht 68.0 in | Wt 158.2 lb

## 2016-02-01 DIAGNOSIS — R3129 Other microscopic hematuria: Secondary | ICD-10-CM | POA: Diagnosis not present

## 2016-02-01 LAB — URINALYSIS, COMPLETE
BILIRUBIN UA: NEGATIVE
GLUCOSE, UA: NEGATIVE
KETONES UA: NEGATIVE
Nitrite, UA: NEGATIVE
PROTEIN UA: NEGATIVE
RBC UA: NEGATIVE
Specific Gravity, UA: <1.005 — ABNORMAL LOW (ref 1.005–1.030)
UUROB: 0.2 mg/dL (ref 0.2–1.0)
pH, UA: 7 (ref 5.0–7.5)

## 2016-02-01 LAB — MICROSCOPIC EXAMINATION
Bacteria, UA: NONE SEEN
Epithelial Cells (non renal): NONE SEEN /hpf (ref 0–10)

## 2016-02-01 NOTE — Progress Notes (Signed)
BP 132/73   Pulse 77   Temp 97.4 F (36.3 C) (Oral)   Ht 5\' 8"  (1.727 m)   Wt 158 lb 4 oz (71.8 kg)   LMP 04/25/1998   BMI 24.06 kg/m    Subjective:    Patient ID: Tiffany Dillon, female    DOB: May 21, 1950, 65 y.o.   MRN: [id number]  HPI: Tiffany Dillon is a 65 y.o. female presenting on 02/01/2016 for Hematuria (saw MMM 2 weeks ago and needed to recheck urine today)   HPI Hematuria Patient comes in for recheck on her urine because she had hematuria on her last one. She has urinary frequency that has been consistent and some stress incontinence and some urge incontinence that is more chronic and not new. She otherwise denies any symptoms from this.  Relevant past medical, surgical, family and social history reviewed and updated as indicated. Interim medical history since our last visit reviewed. Allergies and medications reviewed and updated.  Review of Systems  Constitutional: Negative for chills and fever.  Eyes: Negative for redness and visual disturbance.  Respiratory: Negative for chest tightness and shortness of breath.   Cardiovascular: Negative for chest pain and leg swelling.  Gastrointestinal: Negative for abdominal pain, constipation, diarrhea and vomiting.  Genitourinary: Positive for frequency and urgency. Negative for decreased urine volume, difficulty urinating, dysuria, flank pain, vaginal bleeding, vaginal discharge and vaginal pain.  Musculoskeletal: Negative for back pain and gait problem.  Skin: Negative for rash.  Neurological: Negative for light-headedness and headaches.  Psychiatric/Behavioral: Negative for agitation and behavioral problems.  All other systems reviewed and are negative.   Per HPI unless specifically indicated above      Objective:    BP 132/73   Pulse 77   Temp 97.4 F (36.3 C) (Oral)   Ht 5\' 8"  (1.727 m)   Wt 158 lb 4 oz (71.8 kg)   LMP 04/25/1998   BMI 24.06 kg/m   Wt Readings from Last 3 Encounters:  02/01/16 158 lb  4 oz (71.8 kg)  01/18/16 156 lb (70.8 kg)  09/04/15 156 lb (70.8 kg)    Physical Exam  Constitutional: She is oriented to person, place, and time. She appears well-developed and well-nourished. No distress.  Eyes: Conjunctivae are normal.  Neck: Neck supple. No thyromegaly present.  Cardiovascular: Normal rate, regular rhythm, normal heart sounds and intact distal pulses.   No murmur heard. Pulmonary/Chest: Effort normal and breath sounds normal. No respiratory distress. She has no wheezes.  Abdominal: Soft. Bowel sounds are normal. She exhibits no distension. There is no tenderness. There is no rebound.  Musculoskeletal: Normal range of motion. She exhibits no edema or tenderness.  Lymphadenopathy:    She has no cervical adenopathy.  Neurological: She is alert and oriented to person, place, and time. Coordination normal.  Skin: Skin is warm and dry. No rash noted. She is not diaphoretic.  Psychiatric: She has a normal mood and affect. Her behavior is normal.  Nursing note and vitals reviewed.  Urinalysis: 0-5 Debbie BCs and 0-2 RBCs, likely functional, follow-up in 6 months to year with another urine    Assessment & Plan:   Problem List Items Addressed This Visit    None    Visit Diagnoses    Other microscopic hematuria    -  Primary   Patient here for follow-up on hematuria, 0-5 Debbie BCs and 0-2 RBCs, functional hematuria today, follow up with the urine in one year   Relevant Orders  Urinalysis, Complete       Follow up plan: Return if symptoms worsen or fail to improve.  Counseling provided for all of the vaccine components Orders Placed This Encounter  Procedures  . Urinalysis, Complete    Caryl Pina, MD [facility] 02/01/2016, 3:32 PM

## 2016-02-12 ENCOUNTER — Ambulatory Visit: Payer: Medicare HMO | Admitting: Nurse Practitioner

## 2016-02-27 ENCOUNTER — Encounter: Payer: Self-pay | Admitting: Nurse Practitioner

## 2016-02-27 ENCOUNTER — Ambulatory Visit (INDEPENDENT_AMBULATORY_CARE_PROVIDER_SITE_OTHER): Payer: Medicare HMO | Admitting: Nurse Practitioner

## 2016-02-27 VITALS — BP 130/72 | HR 81 | Temp 97.3°F | Ht 68.0 in | Wt 157.4 lb

## 2016-02-27 DIAGNOSIS — N390 Urinary tract infection, site not specified: Secondary | ICD-10-CM

## 2016-02-27 DIAGNOSIS — R350 Frequency of micturition: Secondary | ICD-10-CM | POA: Diagnosis not present

## 2016-02-27 MED ORDER — CIPROFLOXACIN HCL 500 MG PO TABS: 500.0000 mg | ORAL_TABLET | Freq: Two times a day (BID) | ORAL | 0 refills | Status: DC

## 2016-02-27 NOTE — Progress Notes (Signed)
Subjective:    Tiffany Dillon is a 65 y.o. female who complains of burning with urination, dysuria, frequency, hesitancy, nocturia, suprapubic pressure and urgency. She has had symptoms for 2 days. Patient also complains of nothing else. Patient denies back pain and vaginal discharge. Patient does have a history of recurrent UTI. Patient does not have a history of pyelonephritis.   The following portions of the patient's history were reviewed and updated as appropriate: allergies, current medications, past family history, past medical history, past social history, past surgical history and problem list.  Review of Systems Pertinent items noted in HPI and remainder of comprehensive ROS otherwise negative.    Objective:    General appearance: alert and cooperative Lungs: clear to auscultation bilaterally Heart: regular rate and rhythm, S1, S2 normal, no murmur, click, rub or gallop Abdomen: soft, non-tender; bowel sounds normal; no masses,  no organomegaly  Laboratory:  Urine dipstick: 3+ for leukocyte esterase with 3+ blood Micro exam: not done.    Assessment:    Acute cystitis     Plan:     1. Urinary frequency - Urinalysis - Urine culture  2. UTI (urinary tract infection), uncomplicated Take medication as prescribe Cotton underwear Take shower not bath Cranberry juice, yogurt Force fluids AZO over the counter X2 days Culture pending RTO prn  Meds ordered this encounter  Medications  . ciprofloxacin (CIPRO) 500 MG tablet    Sig: Take 1 tablet (500 mg total) by mouth 2 (two) times daily.    Dispense:  10 tablet    Refill:  0    Order Specific Question:   Supervising Provider    Answer:   Eustaquio Maize [[id number]]    Mary-Margaret Hassell Done, FNP

## 2016-02-27 NOTE — Patient Instructions (Signed)

## 2016-02-29 LAB — URINALYSIS
BILIRUBIN UA: NEGATIVE
GLUCOSE, UA: NEGATIVE
Ketones, UA: NEGATIVE
Nitrite, UA: NEGATIVE
SPEC GRAV UA: 1.015 (ref 1.005–1.030)
UUROB: 0.2 mg/dL (ref 0.2–1.0)
pH, UA: 7.5 (ref 5.0–7.5)

## 2016-03-01 LAB — URINE CULTURE

## 2016-03-07 DIAGNOSIS — L738 Other specified follicular disorders: Secondary | ICD-10-CM | POA: Diagnosis not present

## 2016-03-07 DIAGNOSIS — Z79899 Other long term (current) drug therapy: Secondary | ICD-10-CM | POA: Diagnosis not present

## 2016-03-07 DIAGNOSIS — L821 Other seborrheic keratosis: Secondary | ICD-10-CM | POA: Diagnosis not present

## 2016-03-07 DIAGNOSIS — I788 Other diseases of capillaries: Secondary | ICD-10-CM | POA: Diagnosis not present

## 2016-03-07 DIAGNOSIS — L820 Inflamed seborrheic keratosis: Secondary | ICD-10-CM | POA: Diagnosis not present

## 2016-03-07 DIAGNOSIS — L650 Telogen effluvium: Secondary | ICD-10-CM | POA: Diagnosis not present

## 2016-03-07 DIAGNOSIS — D225 Melanocytic nevi of trunk: Secondary | ICD-10-CM | POA: Diagnosis not present

## 2016-03-07 DIAGNOSIS — D2261 Melanocytic nevi of right upper limb, including shoulder: Secondary | ICD-10-CM | POA: Diagnosis not present

## 2016-04-02 ENCOUNTER — Telehealth: Payer: Self-pay | Admitting: Pediatrics

## 2016-04-02 DIAGNOSIS — N3001 Acute cystitis with hematuria: Secondary | ICD-10-CM

## 2016-04-02 MED ORDER — NITROFURANTOIN MONOHYD MACRO 100 MG PO CAPS: 100.0000 mg | ORAL_CAPSULE | Freq: Two times a day (BID) | ORAL | 0 refills | Status: AC
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
## 2016-04-13 ENCOUNTER — Encounter: Payer: Self-pay | Admitting: Pediatrics

## 2016-04-13 ENCOUNTER — Ambulatory Visit: Payer: Medicare HMO | Admitting: Family Medicine

## 2016-04-13 ENCOUNTER — Ambulatory Visit (INDEPENDENT_AMBULATORY_CARE_PROVIDER_SITE_OTHER): Payer: Medicare HMO | Admitting: Pediatrics

## 2016-04-13 VITALS — BP 129/74 | HR 94 | Temp 97.5°F | Ht 68.0 in | Wt 155.4 lb

## 2016-04-13 DIAGNOSIS — N952 Postmenopausal atrophic vaginitis: Secondary | ICD-10-CM | POA: Diagnosis not present

## 2016-04-13 DIAGNOSIS — R8281 Pyuria: Secondary | ICD-10-CM

## 2016-04-13 DIAGNOSIS — N390 Urinary tract infection, site not specified: Secondary | ICD-10-CM | POA: Diagnosis not present

## 2016-04-13 DIAGNOSIS — R399 Unspecified symptoms and signs involving the genitourinary system: Secondary | ICD-10-CM

## 2016-04-13 LAB — URINALYSIS
Bilirubin, UA: NEGATIVE
Glucose, UA: NEGATIVE
KETONES UA: NEGATIVE
NITRITE UA: NEGATIVE
PH UA: 7 (ref 5.0–7.5)
Specific Gravity, UA: 1.01 (ref 1.005–1.030)
UUROB: 0.2 mg/dL (ref 0.2–1.0)

## 2016-04-13 MED ORDER — CIPROFLOXACIN HCL 500 MG PO TABS: 500.0000 mg | ORAL_TABLET | Freq: Two times a day (BID) | ORAL | 0 refills | Status: DC

## 2016-04-13 MED ORDER — ESTROGENS, CONJUGATED 0.625 MG/GM VA CREA: 1.0000 | TOPICAL_CREAM | VAGINAL | 3 refills | Status: DC

## 2016-04-13 NOTE — Progress Notes (Signed)
  Subjective:   Patient ID: Tiffany Dillon, female    DOB: 1950/07/18, 65 y.o.   MRN: [id number] CC: Hematuria and Urinary Urgency  HPI: Tiffany Dillon is a 64 y.o. female presenting for Hematuria and Urinary Urgency  Has had 4 urinary tract infections this year Usually just once a year  Started again today Woke up about 230am to use bathroom Had red blood in urine this morning Voids have bene progressively less red since then Some diarrhea ongoing as well Felt well last night Some burning and some frequency today No fevers Normal appetite No weight changes Treated by phone for UTI apprx 2 weeks ago, had blood in urine for about day then as well Thinks abx fully treated UTI 2 weeks ago, symptoms entirely resolved    Relevant past medical, surgical, family and social history reviewed. Allergies and medications reviewed and updated. History  Smoking Status  . Never Smoker  Smokeless Tobacco  . Never Used   ROS: Per HPI   Objective:    BP 129/74   Pulse 94   Temp 97.5 F (36.4 C) (Oral)   Ht 5\' 8"  (1.727 m)   Wt 155 lb 6.4 oz (70.5 kg)   LMP 04/25/1998   BMI 23.63 kg/m   Wt Readings from Last 3 Encounters:  04/13/16 155 lb 6.4 oz (70.5 kg)  02/27/16 157 lb 6.4 oz (71.4 kg)  02/01/16 158 lb 4 oz (71.8 kg)    Gen: NAD, alert, cooperative with exam, NCAT EYES: EOMI, no conjunctival injection, or no icterus CV:WWP Resp:  normal WOB Ext: No edema, warm Neuro: Alert and oriented MSK: normal muscle bulk  Assessment & Plan:  Tiffany Dillon was seen today for hematuria and urinary urgency. UA positive for LE, blood Will send for culture Treat as below RTC 3 weeks for repeat UA If still with hematuria, will refer to urology. Start premarin cream as below  Diagnoses and all orders for this visit:  UTI symptoms -     Urinalysis -     Urine culture  Atrophic vaginitis -     conjugated estrogens (PREMARIN) vaginal cream; Place 1 Applicatorful vaginally 2 (two)  times a week.  Pyuria -     ciprofloxacin (CIPRO) 500 MG tablet; Take 1 tablet (500 mg total) by mouth 2 (two) times daily.   Follow up plan: 3 weeks Assunta Found, MD [place]

## 2016-04-14 ENCOUNTER — Encounter: Payer: Self-pay | Admitting: Pediatrics

## 2016-04-15 ENCOUNTER — Encounter: Payer: Self-pay | Admitting: Pediatrics

## 2016-04-15 LAB — URINE CULTURE

## 2016-04-15 MED ORDER — ESTRADIOL 10 MCG VA TABS
1.0000 | ORAL_TABLET | VAGINAL | 2 refills | Status: DC
Start: 2016-04-15 — End: 2016-04-15

## 2016-04-15 MED ORDER — ESTRADIOL 0.1 MG/GM VA CREA: 1.0000 | TOPICAL_CREAM | VAGINAL | 3 refills | Status: DC

## 2016-04-15 NOTE — Telephone Encounter (Signed)
Sent in generic estrace cream.  If not able to afford will try coupon with estradiol tablets.

## 2016-04-19 ENCOUNTER — Ambulatory Visit: Payer: Medicare HMO | Admitting: Pediatrics

## 2016-05-04 ENCOUNTER — Ambulatory Visit (INDEPENDENT_AMBULATORY_CARE_PROVIDER_SITE_OTHER): Payer: Medicare HMO | Admitting: Pediatrics

## 2016-05-04 ENCOUNTER — Encounter: Payer: Self-pay | Admitting: Pediatrics

## 2016-05-04 VITALS — BP 111/69 | HR 77 | Temp 97.5°F | Ht 68.0 in | Wt 158.0 lb

## 2016-05-04 DIAGNOSIS — N3946 Mixed incontinence: Secondary | ICD-10-CM | POA: Insufficient documentation

## 2016-05-04 DIAGNOSIS — N952 Postmenopausal atrophic vaginitis: Secondary | ICD-10-CM | POA: Diagnosis not present

## 2016-05-04 DIAGNOSIS — R319 Hematuria, unspecified: Secondary | ICD-10-CM

## 2016-05-04 DIAGNOSIS — R399 Unspecified symptoms and signs involving the genitourinary system: Secondary | ICD-10-CM

## 2016-05-04 LAB — URINALYSIS, COMPLETE
Bilirubin, UA: NEGATIVE
Glucose, UA: NEGATIVE
KETONES UA: NEGATIVE
Leukocytes, UA: NEGATIVE
NITRITE UA: NEGATIVE
Protein, UA: NEGATIVE
Urobilinogen, Ur: 0.2 mg/dL (ref 0.2–1.0)
pH, UA: 6.5 (ref 5.0–7.5)

## 2016-05-04 LAB — MICROSCOPIC EXAMINATION
Bacteria, UA: NONE SEEN
Epithelial Cells (non renal): NONE SEEN /hpf (ref 0–10)

## 2016-05-04 MED ORDER — ESTRADIOL 0.1 MG/GM VA CREA: 1.0000 g | TOPICAL_CREAM | VAGINAL | 3 refills | Status: DC

## 2016-05-04 MED ORDER — ESTRADIOL 10 MCG VA TABS: 10.0000 ug | ORAL_TABLET | VAGINAL | 3 refills | Status: DC

## 2016-05-04 NOTE — Progress Notes (Signed)
  Subjective:   Patient ID: Tiffany Dillon, female    DOB: May 31, 1950, 66 y.o.   MRN: [id number] CC: Follow-up (UTi)  HPI: Tiffany Dillon is a 66 y.o. female presenting for Follow-up (UTi)  Dysuria gone No more episodes of blood in urine Still having some urgency Occasionally having some leaking with laughing/coughing On vagifem tablets now Not sure if she has noticed difference No fevers No abd pain Does feel like she empties her bladder completely  Relevant past medical, surgical, family and social history reviewed. Allergies and medications reviewed and updated. History  Smoking Status  . Never Smoker  Smokeless Tobacco  . Never Used   ROS: Per HPI   Objective:    BP 111/69   Pulse 77   Temp 97.5 F (36.4 C) (Oral)   Ht 5\' 8"  (1.727 m)   Wt 158 lb (71.7 kg)   LMP 04/25/1998   BMI 24.02 kg/m   Wt Readings from Last 3 Encounters:  05/04/16 158 lb (71.7 kg)  04/13/16 155 lb 6.4 oz (70.5 kg)  02/27/16 157 lb 6.4 oz (71.4 kg)    Gen: NAD, alert, cooperative with exam, NCAT EYES: EOMI, no conjunctival injection, or no icterus CV: NRRR, normal S1/S2, no murmur, distal pulses 2+ b/l Resp: CTABL, no wheezes, normal WOB Abd: +BS, soft, NTND. no guarding or organomegaly Ext: No edema, warm Neuro: Alert and oriented  Assessment & Plan:  Tiffany Dillon was seen today for follow-up.  Diagnoses and all orders for this visit:  Hematuria Resolved, Normal UA -     Urinalysis, Complete  Vaginal atrophy Continue low dose vaginal estrogen -     Estradiol 10 MCG TABS vaginal tablet; Place 1 tablet (10 mcg total) vaginally 2 (two) times a week.  Other orders -     Microscopic Examination  Mixed stress/Urge incontinence kegel exercises, timed voiding Avoid fluids after dinner If ongoing symptoms let me know  Follow up plan: Return in about 3 months (around 08/02/2016). Assunta Found, MD [place]

## 2016-05-04 NOTE — Patient Instructions (Signed)
Kegel exercises--hold for 3-5 seconds with 10 repetitions three times a day  Try to empty your bladder every two hours whether you feel like you have to go or not  Let me know if urgency is getting no better

## 2016-05-05 DIAGNOSIS — R69 Illness, unspecified: Secondary | ICD-10-CM | POA: Diagnosis not present

## 2016-05-20 DIAGNOSIS — H2513 Age-related nuclear cataract, bilateral: Secondary | ICD-10-CM | POA: Diagnosis not present

## 2016-05-20 DIAGNOSIS — H43813 Vitreous degeneration, bilateral: Secondary | ICD-10-CM | POA: Diagnosis not present

## 2016-05-20 DIAGNOSIS — H01001 Unspecified blepharitis right upper eyelid: Secondary | ICD-10-CM | POA: Diagnosis not present

## 2016-05-20 DIAGNOSIS — H524 Presbyopia: Secondary | ICD-10-CM | POA: Diagnosis not present

## 2016-06-06 ENCOUNTER — Other Ambulatory Visit: Payer: Self-pay | Admitting: Obstetrics & Gynecology

## 2016-06-06 DIAGNOSIS — Z1231 Encounter for screening mammogram for malignant neoplasm of breast: Secondary | ICD-10-CM

## 2016-06-23 ENCOUNTER — Ambulatory Visit
Admission: RE | Admit: 2016-06-23 | Discharge: 2016-06-23 | Disposition: A | Discharge status: Home / Self Care | Payer: Medicare HMO | Source: Ambulatory Visit | Attending: Obstetrics & Gynecology | Admitting: Obstetrics & Gynecology

## 2016-06-23 DIAGNOSIS — Z1231 Encounter for screening mammogram for malignant neoplasm of breast: Secondary | ICD-10-CM

## 2016-07-21 ENCOUNTER — Ambulatory Visit (INDEPENDENT_AMBULATORY_CARE_PROVIDER_SITE_OTHER): Payer: Medicare HMO | Admitting: Pediatrics

## 2016-07-21 ENCOUNTER — Encounter: Payer: Self-pay | Admitting: Pediatrics

## 2016-07-21 ENCOUNTER — Ambulatory Visit: Payer: Medicare HMO | Admitting: Family Medicine

## 2016-07-21 VITALS — BP 134/70 | HR 70 | Temp 97.3°F | Ht 68.0 in | Wt 156.0 lb

## 2016-07-21 DIAGNOSIS — N952 Postmenopausal atrophic vaginitis: Secondary | ICD-10-CM | POA: Diagnosis not present

## 2016-07-21 DIAGNOSIS — R198 Other specified symptoms and signs involving the digestive system and abdomen: Secondary | ICD-10-CM | POA: Diagnosis not present

## 2016-07-21 MED ORDER — ESTRADIOL 10 MCG VA TABS: 1.0000 | ORAL_TABLET | VAGINAL | 6 refills | Status: DC

## 2016-07-21 NOTE — Progress Notes (Signed)
  Subjective:   Patient ID: Tiffany Dillon, female    DOB: [birth year], 65 y.o.   MRN: [id number] CC: Follow-up multiple med prob HPI: Tiffany Dillon is a 66 y.o. female presenting for Follow-up  Has been on estrogen vaginal cream Less irritation No further uti symptoms Doing kegel exercises  Thinks symptoms improving Cream v expensive Insurance said vaginal tab was cheaper  Having RUQ fullness Sometimes feels better after eating Doesn't hurt Is a fullness that daily bothers her off and on stooling regularly New for past 3 months No fevers  Relevant past medical, surgical, family and social history reviewed. Allergies and medications reviewed and updated. History  Smoking Status  . Never Smoker  Smokeless Tobacco  . Never Used   ROS: Per HPI   Objective:    BP 134/70   Pulse 70   Temp 97.3 F (36.3 C) (Oral)   Ht _0  (1.727 m)   Wt 156 lb (70.8 kg)   LMP 04/25/1998   BMI 23.72 kg/m   Wt Readings from Last 3 Encounters:  07/21/16 156 lb (70.8 kg)  05/04/16 158 lb (71.7 kg)  04/13/16 155 lb 6.4 oz (70.5 kg)    Gen: NAD, alert, cooperative with exam, NCAT EYES: EOMI, no conjunctival injection, or no icterus CV: NRRR, normal S1/S2, no murmur, distal pulses 2+ b/l Resp: CTABL, no wheezes, normal WOB Abd: +BS, soft, mild discomfort with RUQ palpation, no guarding or organomegaly, no distension Ext: No edema, warm Neuro: Alert and oriented, strength equal b/l UE and LE, coordination grossly normal MSK: normal muscle bulk  Assessment & Plan:  Tiffany Dillon was seen today for follow-up.  Diagnoses and all orders for this visit:  Atrophic vaginitis Local estrogen improving symptoms Paper Rx for vag tabs given -     Estradiol (YUVAFEM) 10 MCG TABS vaginal tablet; Place 1 tablet (10 mcg total) vaginally 2 (two) times a week.  Abdominal fullness RUQ -     CMP14+EGFR -     CBC with Differential/Platelet -     US Abdomen Complete; Future   Follow up plan: 2  mo, sooner if needed Tiffany Found, MD Tiffany Dillon

## 2016-07-22 LAB — CMP14+EGFR
A/G RATIO: 2.2 (ref 1.2–2.2)
ALT: 24 IU/L (ref 0–32)
AST: 26 IU/L (ref 0–40)
Albumin: 4.6 g/dL (ref 3.6–4.8)
Alkaline Phosphatase: 60 IU/L (ref 39–117)
BILIRUBIN TOTAL: 0.6 mg/dL (ref 0.0–1.2)
BUN / CREAT RATIO: 19 (ref 12–28)
BUN: 15 mg/dL (ref 8–27)
CALCIUM: 9.9 mg/dL (ref 8.7–10.3)
CHLORIDE: 101 mmol/L (ref 96–106)
CO2: 27 mmol/L (ref 18–29)
Creatinine, Ser: 0.79 mg/dL (ref 0.57–1.00)
GFR, EST AFRICAN AMERICAN: 90 mL/min/{1.73_m2} (ref >59)
GFR, EST NON AFRICAN AMERICAN: 78 mL/min/{1.73_m2} (ref >59)
GLOBULIN, TOTAL: 2.1 g/dL (ref 1.5–4.5)
Glucose: 89 mg/dL (ref 65–99)
POTASSIUM: 5.2 mmol/L (ref 3.5–5.2)
SODIUM: 142 mmol/L (ref 134–144)
TOTAL PROTEIN: 6.7 g/dL (ref 6.0–8.5)

## 2016-07-22 LAB — CBC WITH DIFFERENTIAL/PLATELET
BASOS: 1 %
Basophils Absolute: 0 10*3/uL (ref 0.0–0.2)
EOS (ABSOLUTE): 0.1 10*3/uL (ref 0.0–0.4)
Eos: 2 %
HEMOGLOBIN: 15.1 g/dL (ref 11.1–15.9)
Hematocrit: 46.6 % (ref 34.0–46.6)
IMMATURE GRANS (ABS): 0 10*3/uL (ref 0.0–0.1)
Immature Granulocytes: 0 %
LYMPHS: 23 %
Lymphocytes Absolute: 1.4 10*3/uL (ref 0.7–3.1)
MCH: 30.7 pg (ref 26.6–33.0)
MCHC: 32.4 g/dL (ref 31.5–35.7)
MCV: 95 fL (ref 79–97)
MONOS ABS: 0.4 10*3/uL (ref 0.1–0.9)
Monocytes: 6 %
NEUTROS ABS: 4.3 10*3/uL (ref 1.4–7.0)
Neutrophils: 68 %
PLATELETS: 201 10*3/uL (ref 150–379)
RBC: 4.92 x10E6/uL (ref 3.77–5.28)
RDW: 13.3 % (ref 12.3–15.4)
WBC: 6.3 10*3/uL (ref 3.4–10.8)

## 2016-07-25 ENCOUNTER — Encounter: Payer: Self-pay | Admitting: Pediatrics

## 2016-07-26 ENCOUNTER — Ambulatory Visit (HOSPITAL_COMMUNITY)
Admission: RE | Admit: 2016-07-26 | Discharge: 2016-07-26 | Disposition: A | Discharge status: Home / Self Care | Payer: Medicare HMO | Source: Ambulatory Visit | Attending: Pediatrics | Admitting: Pediatrics

## 2016-07-26 DIAGNOSIS — K760 Fatty (change of) liver, not elsewhere classified: Secondary | ICD-10-CM | POA: Diagnosis not present

## 2016-07-26 DIAGNOSIS — R198 Other specified symptoms and signs involving the digestive system and abdomen: Secondary | ICD-10-CM | POA: Insufficient documentation

## 2016-07-26 DIAGNOSIS — R1011 Right upper quadrant pain: Secondary | ICD-10-CM | POA: Diagnosis not present

## 2016-07-31 ENCOUNTER — Encounter: Payer: Self-pay | Admitting: Pediatrics

## 2016-08-24 ENCOUNTER — Ambulatory Visit (INDEPENDENT_AMBULATORY_CARE_PROVIDER_SITE_OTHER): Payer: Medicare HMO | Admitting: Family Medicine

## 2016-08-24 ENCOUNTER — Encounter: Payer: Self-pay | Admitting: Family Medicine

## 2016-08-24 VITALS — BP 132/80 | HR 76 | Temp 98.6°F | Ht 68.0 in | Wt 156.0 lb

## 2016-08-24 DIAGNOSIS — S3091XA Unspecified superficial injury of lower back and pelvis, initial encounter: Secondary | ICD-10-CM

## 2016-08-24 DIAGNOSIS — W57XXXA Bitten or stung by nonvenomous insect and other nonvenomous arthropods, initial encounter: Secondary | ICD-10-CM

## 2016-08-24 MED ORDER — DOXYCYCLINE HYCLATE 100 MG PO CAPS: 100.0000 mg | ORAL_CAPSULE | Freq: Two times a day (BID) | ORAL | 0 refills | Status: DC

## 2016-08-24 MED ORDER — TRIAMCINOLONE ACETONIDE 0.1 % EX CREA: 1.0000 "application " | TOPICAL_CREAM | Freq: Three times a day (TID) | CUTANEOUS | 0 refills | Status: DC
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
## 2016-11-15 DIAGNOSIS — R69 Illness, unspecified: Secondary | ICD-10-CM | POA: Diagnosis not present

## 2016-12-10 ENCOUNTER — Ambulatory Visit (INDEPENDENT_AMBULATORY_CARE_PROVIDER_SITE_OTHER): Payer: Medicare HMO | Admitting: Family Medicine

## 2016-12-10 ENCOUNTER — Encounter: Payer: Self-pay | Admitting: Family Medicine

## 2016-12-10 VITALS — BP 113/69 | HR 89 | Temp 97.0°F | Ht 68.0 in | Wt 125.0 lb

## 2016-12-10 DIAGNOSIS — H6121 Impacted cerumen, right ear: Secondary | ICD-10-CM

## 2016-12-10 DIAGNOSIS — R42 Dizziness and giddiness: Secondary | ICD-10-CM

## 2016-12-10 DIAGNOSIS — R899 Unspecified abnormal finding in specimens from other organs, systems and tissues: Secondary | ICD-10-CM | POA: Diagnosis not present

## 2016-12-10 NOTE — Progress Notes (Signed)
BP 113/69   Pulse 89   Temp (!) 97 F (36.1 C) (Oral)   Ht '5\' 8"'  (1.727 m)   Wt 125 lb (56.7 kg)   LMP 04/25/1998   BMI 19.01 kg/m    Subjective:    Patient ID: Tiffany Dillon, female    DOB: 04/24/1951, 66 y.o.   MRN: [id number]  HPI: Tiffany Dillon is a 66 y.o. female presenting on 12/10/2016 for Feels like balance is off (began 2 days ago, comes and goes)   HPI Lightheadedness and off balance. Patient complains of feeling lightheaded and off balance that has been going on increasingly over the past 3 days. She says it's fine at rest but when she gets up and walks around she feels lightheaded and dizzy and off-balance and feels like she might pass out. She is taking her blood pressure at home and it's been 129/60 and here in the office since 113/69. Her heart rate today is 89. She denies any chest pain or shortness of breath or cough or sinus pressure or congestion. She might have a little bit of plugged or stuffy feeling in her ears but will not denies any other symptoms. She denies any diarrhea or nausea or vomiting or urinary symptoms. She denies any vaginal symptoms such as discharge or bleeding.  Relevant past medical, surgical, family and social history reviewed and updated as indicated. Interim medical history since our last visit reviewed. Allergies and medications reviewed and updated.  Review of Systems  Constitutional: Negative for chills and fever.  HENT: Positive for hearing loss. Negative for congestion, ear discharge, ear pain, postnasal drip, rhinorrhea, sinus pain and sinus pressure.   Eyes: Negative for redness and visual disturbance.  Respiratory: Negative for cough, chest tightness and shortness of breath.   Cardiovascular: Negative for chest pain and leg swelling.  Gastrointestinal: Positive for constipation. Negative for abdominal pain, blood in stool, diarrhea, nausea and vomiting.  Genitourinary: Negative for difficulty urinating, dysuria, flank pain,  frequency, hematuria, urgency, vaginal bleeding, vaginal discharge and vaginal pain.  Musculoskeletal: Negative for back pain and gait problem.  Skin: Negative for rash.  Neurological: Positive for dizziness and light-headedness. Negative for weakness, numbness and headaches.  Psychiatric/Behavioral: Negative for agitation and behavioral problems.  All other systems reviewed and are negative.   Per HPI unless specifically indicated above        Objective:    BP 113/69   Pulse 89   Temp (!) 97 F (36.1 C) (Oral)   Ht '5\' 8"'  (1.727 m)   Wt 125 lb (56.7 kg)   LMP 04/25/1998   BMI 19.01 kg/m   Wt Readings from Last 3 Encounters:  12/10/16 125 lb (56.7 kg)  08/24/16 156 lb (70.8 kg)  07/21/16 156 lb (70.8 kg)    Physical Exam  Constitutional: She is oriented to person, place, and time. She appears well-developed and well-nourished. No distress.  HENT:  Right Ear: There is drainage (Right ear cerumen impaction). No swelling or tenderness.  Left Ear: Tympanic membrane and external ear normal.  Nose: Nose normal.  Mouth/Throat: Oropharynx is clear and moist. No oropharyngeal exudate.  Eyes: Conjunctivae are normal.  Neck: Neck supple. No thyromegaly present.  Cardiovascular: Normal rate, regular rhythm, normal heart sounds and intact distal pulses.   No murmur heard. Pulmonary/Chest: Effort normal and breath sounds normal. No respiratory distress. She has no wheezes. She has no rales.  Abdominal: Soft. Bowel sounds are normal. She exhibits no distension. There is no tenderness.  There is no rebound and no guarding.  Musculoskeletal: Normal range of motion. She exhibits no edema.  Lymphadenopathy:    She has no cervical adenopathy.  Neurological: She is alert and oriented to person, place, and time. Coordination normal.  Skin: Skin is warm and dry. No rash noted. She is not diaphoretic.  Psychiatric: She has a normal mood and affect. Her behavior is normal.  Nursing note and  vitals reviewed.  Cerumen lavage: Nurse to lavage cerumen from right ear, patient tolerated well, able to clear.    Assessment & Plan:   Problem List Items Addressed This Visit    None    Visit Diagnoses    Lightheadedness    -  Primary   Patient feels more lightheaded like ambulating, will run some labs and see if anything shows up for it.   Relevant Orders   CBC with Differential/Platelet   CMP14+EGFR   Thyroid Panel With TSH   Vitamin B12   Hearing loss of right ear due to cerumen impaction           Follow up plan: Return if symptoms worsen or fail to improve.  Counseling provided for all of the vaccine components Orders Placed This Encounter  Procedures  . CBC with Differential/Platelet  . CMP14+EGFR  . Thyroid Panel With TSH  . Vitamin B12    Caryl Pina, MD [facility] 12/10/2016, 11:18 AM

## 2016-12-11 LAB — CMP14+EGFR
ALBUMIN: 4.6 g/dL (ref 3.6–4.8)
ALT: 21 IU/L (ref 0–32)
AST: 25 IU/L (ref 0–40)
Albumin/Globulin Ratio: 2.3 — ABNORMAL HIGH (ref 1.2–2.2)
Alkaline Phosphatase: 58 IU/L (ref 39–117)
BUN / CREAT RATIO: 21 (ref 12–28)
BUN: 18 mg/dL (ref 8–27)
Bilirubin Total: 0.5 mg/dL (ref 0.0–1.2)
CO2: 25 mmol/L (ref 20–29)
CREATININE: 0.87 mg/dL (ref 0.57–1.00)
Calcium: 9.4 mg/dL (ref 8.7–10.3)
Chloride: 102 mmol/L (ref 96–106)
GFR calc non Af Amer: 69 mL/min/{1.73_m2} (ref >59)
GFR, EST AFRICAN AMERICAN: 80 mL/min/{1.73_m2} (ref >59)
GLUCOSE: 103 mg/dL — AB (ref 65–99)
Globulin, Total: 2 g/dL (ref 1.5–4.5)
Potassium: 4.6 mmol/L (ref 3.5–5.2)
Sodium: 139 mmol/L (ref 134–144)
TOTAL PROTEIN: 6.6 g/dL (ref 6.0–8.5)

## 2016-12-11 LAB — THYROID PANEL WITH TSH
FREE THYROXINE INDEX: 1.7 (ref 1.2–4.9)
T3 UPTAKE RATIO: 27 % (ref 24–39)
T4, Total: 6.2 ug/dL (ref 4.5–12.0)
TSH: 3.54 u[IU]/mL (ref 0.450–4.500)

## 2016-12-11 LAB — CBC WITH DIFFERENTIAL/PLATELET
BASOS ABS: 0 10*3/uL (ref 0.0–0.2)
Basos: 0 %
EOS (ABSOLUTE): 0.1 10*3/uL (ref 0.0–0.4)
Eos: 3 %
HEMOGLOBIN: 14.8 g/dL (ref 11.1–15.9)
Hematocrit: 45 % (ref 34.0–46.6)
IMMATURE GRANS (ABS): 0 10*3/uL (ref 0.0–0.1)
Immature Granulocytes: 0 %
LYMPHS: 19 %
Lymphocytes Absolute: 1.1 10*3/uL (ref 0.7–3.1)
MCH: 31 pg (ref 26.6–33.0)
MCHC: 32.9 g/dL (ref 31.5–35.7)
MCV: 94 fL (ref 79–97)
MONOCYTES: 6 %
Monocytes Absolute: 0.3 10*3/uL (ref 0.1–0.9)
NEUTROS ABS: 3.9 10*3/uL (ref 1.4–7.0)
Neutrophils: 72 %
Platelets: 200 10*3/uL (ref 150–379)
RBC: 4.77 x10E6/uL (ref 3.77–5.28)
RDW: 13 % (ref 12.3–15.4)
WBC: 5.5 10*3/uL (ref 3.4–10.8)

## 2016-12-11 LAB — VITAMIN B12: VITAMIN B 12: 1230 pg/mL (ref 232–1245)

## 2016-12-13 ENCOUNTER — Encounter: Payer: Self-pay | Admitting: Family Medicine

## 2016-12-14 LAB — HGB A1C W/O EAG: HEMOGLOBIN A1C: 5.1 % (ref 4.8–5.6)

## 2016-12-14 LAB — SPECIMEN STATUS REPORT
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
## 2016-12-16 ENCOUNTER — Other Ambulatory Visit (HOSPITAL_COMMUNITY)
Admission: RE | Admit: 2016-12-16 | Discharge: 2016-12-16 | Disposition: A | Discharge status: Home / Self Care | Payer: Medicare HMO | Source: Ambulatory Visit | Attending: Obstetrics & Gynecology | Admitting: Obstetrics & Gynecology

## 2016-12-16 ENCOUNTER — Ambulatory Visit (INDEPENDENT_AMBULATORY_CARE_PROVIDER_SITE_OTHER): Payer: Medicare HMO | Admitting: Obstetrics & Gynecology

## 2016-12-16 ENCOUNTER — Encounter: Payer: Self-pay | Admitting: Obstetrics & Gynecology

## 2016-12-16 VITALS — BP 120/78 | HR 72 | Resp 16 | Ht 68.0 in | Wt 154.0 lb

## 2016-12-16 DIAGNOSIS — Z124 Encounter for screening for malignant neoplasm of cervix: Secondary | ICD-10-CM | POA: Insufficient documentation

## 2016-12-16 DIAGNOSIS — Z01419 Encounter for gynecological examination (general) (routine) without abnormal findings: Secondary | ICD-10-CM

## 2016-12-16 DIAGNOSIS — E559 Vitamin D deficiency, unspecified: Secondary | ICD-10-CM

## 2016-12-16 DIAGNOSIS — N390 Urinary tract infection, site not specified: Secondary | ICD-10-CM | POA: Diagnosis not present

## 2016-12-16 DIAGNOSIS — Z0000 Encounter for general adult medical examination without abnormal findings: Secondary | ICD-10-CM | POA: Diagnosis not present

## 2016-12-16 DIAGNOSIS — E785 Hyperlipidemia, unspecified: Secondary | ICD-10-CM | POA: Diagnosis not present

## 2016-12-16 DIAGNOSIS — N952 Postmenopausal atrophic vaginitis: Secondary | ICD-10-CM | POA: Diagnosis not present

## 2016-12-16 DIAGNOSIS — D280 Benign neoplasm of vulva: Secondary | ICD-10-CM | POA: Diagnosis not present

## 2016-12-16 DIAGNOSIS — N9089 Other specified noninflammatory disorders of vulva and perineum: Secondary | ICD-10-CM | POA: Diagnosis not present

## 2016-12-16 LAB — POCT URINALYSIS DIPSTICK
Bilirubin, UA: NEGATIVE
Glucose, UA: NEGATIVE
Ketones, UA: NEGATIVE
NITRITE UA: NEGATIVE
PH UA: 7 (ref 5.0–8.0)
PROTEIN UA: NEGATIVE
RBC UA: NEGATIVE
UROBILINOGEN UA: 0.2 U/dL

## 2016-12-16 MED ORDER — ESTRADIOL 10 MCG VA TABS: 1.0000 | ORAL_TABLET | VAGINAL | 4 refills | Status: DC

## 2016-12-16 NOTE — Patient Instructions (Signed)
Shingrix.  This is the new shingles vaccine.

## 2016-12-17 LAB — LIPID PANEL
CHOL/HDL RATIO: 2.6 ratio (ref 0.0–4.4)
Cholesterol, Total: 210 mg/dL — ABNORMAL HIGH (ref 100–199)
HDL: 81 mg/dL (ref >39)
LDL Calculated: 115 mg/dL — ABNORMAL HIGH (ref 0–99)
TRIGLYCERIDES: 68 mg/dL (ref 0–149)
VLDL Cholesterol Cal: 14 mg/dL (ref 5–40)

## 2016-12-17 LAB — URINE CULTURE

## 2016-12-17 LAB — VITAMIN D 25 HYDROXY (VIT D DEFICIENCY, FRACTURES): Vit D, 25-Hydroxy: 50.4 ng/mL (ref 30.0–100.0)

## 2016-12-19 LAB — CYTOLOGY - PAP: Diagnosis: NEGATIVE

## 2016-12-20 ENCOUNTER — Telehealth: Payer: Self-pay | Admitting: *Deleted

## 2016-12-20 NOTE — Telephone Encounter (Signed)
Returned call to patient. All results reviewed with patient and she verbalized understanding. Patient requests copy of labs be mailed to her. Verbal release of records filled out and taken to [facility] for processing. Patient has aex scheduled for 02/16/18.    Patient agreeable to disposition. Will close encounter.

## 2016-12-20 NOTE — Telephone Encounter (Signed)
Call to patient. Spoke with patient's husband, Juanda Crumble, okay per DPR. Juanda Crumble states patient is at work and will have patient return call to office when she gets home.

## 2016-12-20 NOTE — Telephone Encounter (Signed)
Patient returning your call.

## 2016-12-20 NOTE — Telephone Encounter (Signed)
-----   Message from Megan Salon, MD sent at 12/20/2016  5:44 AM EDT ----- Please notify pt that her cholestrol and LDLs are mildly elevated at 210 and 115 but this is fine to watch.  Her Vit D was normal.  Urine culture was negative for infection.  (She's had three UTIs within the past year.)  Her pap was normal.  02 recall.    The area I removed on her vulva was a benign mole.  There were no atypical cells so no additional procedures/excisions needed.

## 2017-01-30 ENCOUNTER — Ambulatory Visit (INDEPENDENT_AMBULATORY_CARE_PROVIDER_SITE_OTHER): Payer: Medicare HMO

## 2017-01-30 DIAGNOSIS — Z23 Encounter for immunization: Secondary | ICD-10-CM | POA: Diagnosis not present

## 2017-01-30 NOTE — Addendum Note (Signed)
Addended by: Zannie Cove on: 01/30/2017 02:58 PM   Modules accepted: Orders

## 2017-02-08 ENCOUNTER — Encounter: Payer: Self-pay | Admitting: Gastroenterology

## 2017-04-13 ENCOUNTER — Other Ambulatory Visit: Payer: Self-pay

## 2017-04-13 ENCOUNTER — Ambulatory Visit (AMBULATORY_SURGERY_CENTER): Payer: Self-pay

## 2017-04-13 VITALS — Ht 68.0 in | Wt 158.0 lb

## 2017-04-13 DIAGNOSIS — Z86010 Personal history of colonic polyps: Secondary | ICD-10-CM

## 2017-04-13 MED ORDER — NA SULFATE-K SULFATE-MG SULF 17.5-3.13-1.6 GM/177ML PO SOLN: 1.0000 | Freq: Once | ORAL | 0 refills | Status: AC

## 2017-04-13 NOTE — Progress Notes (Signed)
Denies allergies to eggs or soy products. Denies complication of anesthesia or sedation. Denies use of weight loss medication. Denies use of O2.   Emmi instructions declined.  

## 2017-04-17 ENCOUNTER — Telehealth: Payer: Self-pay | Admitting: Gastroenterology

## 2017-04-17 NOTE — Telephone Encounter (Signed)
Pt. aware to come to the office to pick up free bowel prep.

## 2017-04-24 ENCOUNTER — Other Ambulatory Visit: Payer: Self-pay | Admitting: Physician Assistant

## 2017-04-24 MED ORDER — NITROFURANTOIN MONOHYD MACRO 100 MG PO CAPS: 100.0000 mg | ORAL_CAPSULE | Freq: Two times a day (BID) | ORAL | 0 refills | Status: DC

## 2017-04-25 DIAGNOSIS — C50919 Malignant neoplasm of unspecified site of unspecified female breast: Secondary | ICD-10-CM

## 2017-04-25 DIAGNOSIS — Z923 Personal history of irradiation: Secondary | ICD-10-CM

## 2017-04-25 HISTORY — DX: Personal history of irradiation: Z92.3

## 2017-04-25 HISTORY — DX: Malignant neoplasm of unspecified site of unspecified female breast: C50.919

## 2017-04-26 ENCOUNTER — Encounter: Payer: Self-pay | Admitting: Pediatrics

## 2017-04-26 ENCOUNTER — Ambulatory Visit (INDEPENDENT_AMBULATORY_CARE_PROVIDER_SITE_OTHER): Payer: Medicare HMO | Admitting: Pediatrics

## 2017-04-26 VITALS — BP 144/79 | HR 84 | Temp 97.2°F | Ht 68.0 in | Wt 157.4 lb

## 2017-04-26 DIAGNOSIS — N3090 Cystitis, unspecified without hematuria: Secondary | ICD-10-CM | POA: Diagnosis not present

## 2017-04-26 DIAGNOSIS — R030 Elevated blood-pressure reading, without diagnosis of hypertension: Secondary | ICD-10-CM

## 2017-04-26 DIAGNOSIS — R399 Unspecified symptoms and signs involving the genitourinary system: Secondary | ICD-10-CM | POA: Diagnosis not present

## 2017-04-26 LAB — MICROSCOPIC EXAMINATION

## 2017-04-26 LAB — URINALYSIS, COMPLETE
BILIRUBIN UA: NEGATIVE
Glucose, UA: NEGATIVE
KETONES UA: NEGATIVE
Nitrite, UA: POSITIVE — AB
PROTEIN UA: NEGATIVE
SPEC GRAV UA: 1.01 (ref 1.005–1.030)
UUROB: 0.2 mg/dL (ref 0.2–1.0)
pH, UA: 7 (ref 5.0–7.5)

## 2017-04-26 NOTE — Progress Notes (Addendum)
  Subjective:   Patient ID: Tiffany Dillon, female    DOB: 03/27/1951, 67 y.o.   MRN: [id number] CC: Urinary Frequency and Urinary Urgency  HPI: Tiffany Dillon is a 67 y.o. female presenting for Urinary Frequency and Urinary Urgency  Ongoing past 5 days No fevers Started on nitrofurantoin 2 days ago by phone as clinic was closed Says she has only taken one pill, worried about continuing Has been on this medicine in the past without side effects Continues to have some frequency and urgency No dysuria  Says she does not check her blood pressure at home but can, has had elevated blood pressure readings in the past Tends to be nervous when she is in the doctor's office No shortness of breath, chest pain  Taking iron daily to help with her hair loss, started by dermatology Has follow-up with dermatology later this month Has been having some constipation with the iron  Relevant past medical, surgical, family and social history reviewed. Allergies and medications reviewed and updated. Social History   Tobacco Use  Smoking Status Never Smoker  Smokeless Tobacco Never Used   ROS: Per HPI   Objective:    BP (!) 144/79   Pulse 84   Temp (!) 97.2 F (36.2 C) (Oral)   Ht 5\' 8"  (1.727 m)   Wt 157 lb 6.4 oz (71.4 kg)   LMP 04/25/1998   BMI 23.93 kg/m   Wt Readings from Last 3 Encounters:  04/28/17 158 lb (71.7 kg)  04/26/17 157 lb 6.4 oz (71.4 kg)  04/13/17 158 lb (71.7 kg)    Gen: NAD, alert, cooperative with exam, NCAT EYES: EOMI, no conjunctival injection, or no icterus ENT:  OP without erythema LYMPH: no cervical LAD CV: NRRR, normal S1/S2, no murmur, distal pulses 2+ b/l Resp: CTABL, no wheezes, normal WOB Abd: +BS, soft, NTND. no guarding or organomegaly, no CVA tenderness Ext: No edema, warm Neuro: Alert and oriented, strength equal b/l UE and LE, coordination grossly normal MSK: normal muscle bulk  Assessment & Plan:  Tiffany Dillon was seen today for urinary  frequency and urinary urgency.  Diagnoses and all orders for this visit:  UTI symptoms UA positive, follow-up culture, may have no growth to the 1 dose of antibiotics -     Urinalysis, Complete -     Urine Culture; Future  Elevated blood pressure reading Suspect patient may have some whitecoat hypertension.  She is going to check her blood pressures at home regularly and bring to her next clinic visit.  If blood pressure remains high with clinic visits and with no lows at home, may consider starting medicine   Cystitis Restart nitrofurantoin, take until complete  Other orders -     Microscopic Examination  Follow up plan: Return in about 2 months (around 06/24/2017). Assunta Found, MD [place]

## 2017-04-26 NOTE — Patient Instructions (Addendum)
OK to take iron every other day  Check blood pressure at home Goal <140 on top <90 on bottom  If elevated recheck blood pressure in 10-15 minutes Write numbers down, bring to next visit in 2 months

## 2017-04-28 ENCOUNTER — Other Ambulatory Visit: Payer: Self-pay

## 2017-04-28 ENCOUNTER — Ambulatory Visit (AMBULATORY_SURGERY_CENTER): Payer: Medicare HMO | Admitting: Gastroenterology

## 2017-04-28 ENCOUNTER — Encounter: Payer: Self-pay | Admitting: Gastroenterology

## 2017-04-28 VITALS — BP 127/73 | HR 62 | Temp 97.3°F | Resp 13 | Ht 68.0 in | Wt 158.0 lb

## 2017-04-28 DIAGNOSIS — Z86010 Personal history of colonic polyps: Secondary | ICD-10-CM

## 2017-04-28 DIAGNOSIS — Z1211 Encounter for screening for malignant neoplasm of colon: Secondary | ICD-10-CM

## 2017-04-28 MED ORDER — SODIUM CHLORIDE 0.9 % IV SOLN: 500.0000 mL | INTRAVENOUS | Status: DC

## 2017-04-28 NOTE — Op Note (Signed)
[facility] Patient Name: Tiffany Dillon Procedure Date: 04/28/2017 1:32 PM MRN: [id number] Endoscopist: Mallie Mussel L. Loletha Carrow , MD Age: 67 Referring MD:  Date of Birth: [birth year] Gender: Female Account #: [id number] Procedure:                Colonoscopy Indications:              High risk colon cancer surveillance: Personal                            history of sessile serrated colon polyp (less than                            10 mm in size) with no dysplasia; Oct 2013 Medicines:                Monitored Anesthesia Care Procedure:                Pre-Anesthesia Assessment:                           - Prior to the procedure, a History and Physical                            was performed, and patient medications and                            allergies were reviewed. The patient's tolerance of                            previous anesthesia was also reviewed. The risks                            and benefits of the procedure and the sedation                            options and risks were discussed with the patient.                            All questions were answered, and informed consent                            was obtained. Anticoagulants: The patient has taken                            aspirin. It was decided not to withhold this                            medication prior to the procedure. ASA Grade                            Assessment: II - A patient with mild systemic                            disease. After reviewing the risks and benefits,  the patient was deemed in satisfactory condition to                            undergo the procedure.                           After obtaining informed consent, the colonoscope                            was passed under direct vision. Throughout the                            procedure, the patient's blood pressure, pulse, and                            oxygen saturations were monitored  continuously. The                            Colonoscope was introduced through the anus and                            advanced to the the terminal ileum. The colonoscopy                            was performed without difficulty. The patient                            tolerated the procedure well. The quality of the                            bowel preparation was excellent. The terminal                            ileum, ileocecal valve, appendiceal orifice, and                            rectum were photographed. Scope In: 1:37:37 PM Scope Out: 1:53:34 PM Scope Withdrawal Time: 0 hours 10 minutes 41 seconds  Total Procedure Duration: 0 hours 15 minutes 57 seconds  Findings:                 The perianal and digital rectal examinations were                            normal.                           The terminal ileum appeared normal.                           The colon (entire examined portion) was moderately                            tortuous and redundant.  An area of melanosis was found in the entire colon. Complications:            No immediate complications. Estimated Blood Loss:     Estimated blood loss: none. Impression:               - The examined portion of the ileum was normal.                           - Tortuous colon.                           - Melanosis in the colon.                           - No specimens collected. Recommendation:           - Patient has a contact number available for                            emergencies. The signs and symptoms of potential                            delayed complications were discussed with the                            patient. Return to normal activities tomorrow.                            Written discharge instructions were provided to the                            patient.                           - Resume previous diet.                           - Continue present medications.                            - Repeat colonoscopy in 10 years for screening                            purposes. [name] L. Loletha Carrow, MD 04/28/2017 2:02:42 PM This report has been signed electronically.

## 2017-04-28 NOTE — Progress Notes (Signed)
A and O x3. Report to RN. Tolerated MAC anesthesia well.

## 2017-04-28 NOTE — Patient Instructions (Signed)

## 2017-04-28 NOTE — Progress Notes (Signed)
Pt's states no medical or surgical changes since previsit or office visit.Pt's states no medical or surgical changes since previsit or office visit.Pt's states no medical or surgical changes since previsit or office visit.

## 2017-05-01 ENCOUNTER — Telehealth: Payer: Self-pay | Admitting: *Deleted

## 2017-05-01 NOTE — Telephone Encounter (Signed)
  Follow up Call-  Call back number 04/28/2017  Post procedure Call Back phone  # [phone]  Permission to leave phone message Yes  Some recent data might be hidden     Patient questions:  Do you have a fever, pain , or abdominal swelling? No. Pain Score  0 *  Have you tolerated food without any problems? Yes.    Have you been able to return to your normal activities? Yes.    Do you have any questions about your discharge instructions: Diet   No. Medications  No. Follow up visit  No.  Do you have questions or concerns about your Care? No.  Actions: * If pain score is 4 or above: No action needed, pain <4.

## 2017-05-16 DIAGNOSIS — D225 Melanocytic nevi of trunk: Secondary | ICD-10-CM | POA: Diagnosis not present

## 2017-05-16 DIAGNOSIS — D485 Neoplasm of uncertain behavior of skin: Secondary | ICD-10-CM | POA: Diagnosis not present

## 2017-05-16 DIAGNOSIS — L570 Actinic keratosis: Secondary | ICD-10-CM | POA: Diagnosis not present

## 2017-05-16 DIAGNOSIS — L821 Other seborrheic keratosis: Secondary | ICD-10-CM | POA: Diagnosis not present

## 2017-05-16 DIAGNOSIS — D2272 Melanocytic nevi of left lower limb, including hip: Secondary | ICD-10-CM | POA: Diagnosis not present

## 2017-05-16 DIAGNOSIS — D2261 Melanocytic nevi of right upper limb, including shoulder: Secondary | ICD-10-CM | POA: Diagnosis not present

## 2017-05-16 DIAGNOSIS — D1801 Hemangioma of skin and subcutaneous tissue: Secondary | ICD-10-CM | POA: Diagnosis not present

## 2017-05-16 DIAGNOSIS — D2262 Melanocytic nevi of left upper limb, including shoulder: Secondary | ICD-10-CM | POA: Diagnosis not present

## 2017-05-23 DIAGNOSIS — H2513 Age-related nuclear cataract, bilateral: Secondary | ICD-10-CM | POA: Diagnosis not present

## 2017-05-23 DIAGNOSIS — H524 Presbyopia: Secondary | ICD-10-CM | POA: Diagnosis not present

## 2017-05-23 DIAGNOSIS — H43813 Vitreous degeneration, bilateral: Secondary | ICD-10-CM | POA: Diagnosis not present

## 2017-05-23 DIAGNOSIS — H0100A Unspecified blepharitis right eye, upper and lower eyelids: Secondary | ICD-10-CM | POA: Diagnosis not present

## 2017-05-29 DIAGNOSIS — R69 Illness, unspecified: Secondary | ICD-10-CM | POA: Diagnosis not present

## 2017-06-12 DIAGNOSIS — R69 Illness, unspecified: Secondary | ICD-10-CM | POA: Diagnosis not present

## 2017-06-26 ENCOUNTER — Encounter: Payer: Self-pay | Admitting: Pediatrics

## 2017-06-26 ENCOUNTER — Ambulatory Visit (INDEPENDENT_AMBULATORY_CARE_PROVIDER_SITE_OTHER): Payer: Medicare HMO | Admitting: Pediatrics

## 2017-06-26 VITALS — BP 136/74 | HR 80 | Temp 97.5°F | Ht 68.0 in | Wt 154.0 lb

## 2017-06-26 DIAGNOSIS — R030 Elevated blood-pressure reading, without diagnosis of hypertension: Secondary | ICD-10-CM | POA: Diagnosis not present

## 2017-06-26 DIAGNOSIS — L659 Nonscarring hair loss, unspecified: Secondary | ICD-10-CM

## 2017-06-26 NOTE — Progress Notes (Signed)
  Subjective:   Patient ID: Tiffany Dillon, female    DOB: 08/24/1950, 67 y.o.   MRN: [id number] CC: Follow-up (2 month) multiple med problems HPI: Kenedee Quirk is a 67 y.o. female presenting for Follow-up (2 month)  Elevated BPs: checked at home, 110s-120s SBP. As low as upper 90s. No lightheadedness, no dizzyness, no SOB or CP. Not on nay BP medicine now.  Recent Colonoscopy. At pt's request, went over report and findings.   Continues to take iron tablets every other day for hair loss. Following with dermatology. She thinks hair has been improved past few months. Denies recent constipation.  Relevant past medical, surgical, family and social history reviewed. Allergies and medications reviewed and updated. Social History   Tobacco Use  Smoking Status Never Smoker  Smokeless Tobacco Never Used   ROS: Per HPI   Objective:    BP 136/74   Pulse 80   Temp (!) 97.5 F (36.4 C) (Oral)   Ht 5\' 8"  (1.727 m)   Wt 154 lb (69.9 kg)   LMP 04/25/1998   BMI 23.42 kg/m   Wt Readings from Last 3 Encounters:  06/26/17 154 lb (69.9 kg)  04/28/17 158 lb (71.7 kg)  04/26/17 157 lb 6.4 oz (71.4 kg)    Gen: NAD, alert, cooperative with exam, NCAT EYES: EOMI, no conjunctival injection, or no icterus ENT:  TMs pearly gray b/l, OP without erythema LYMPH: no cervical LAD CV: NRRR, normal S1/S2, no murmur, distal pulses 2+ b/l Resp: CTABL, no wheezes, normal WOB Abd: +BS, soft, NTND. no guarding or organomegaly Ext: No edema, warm Neuro: Alert and oriented, strength equal b/l UE and LE, coordination grossly normal MSK: normal muscle bulk  Assessment & Plan:  Raea was seen today for follow-up med problems.  Diagnoses and all orders for this visit:  Elevated blood pressure reading [place] hypertension. Cont to check at home. No numbers at home were elevated, some quite low. Will hold off on BP medication at this time.   Hair thinning Improving. OK to continue iron a few  days a week, not causing constipation. Offered retest of ferritin, would consider dc iron supplementation if normal. Pt declines.  Total time spent with the patient was 15 minutes with greater than 50% of the time spent in face-to-face consultation discussing above as well as treatment going forward.   Follow up plan: As needed Assunta Found, MD [place]

## 2017-06-27 ENCOUNTER — Encounter: Payer: Self-pay | Admitting: Pediatrics

## 2017-07-04 ENCOUNTER — Other Ambulatory Visit: Payer: Self-pay | Admitting: Obstetrics & Gynecology

## 2017-07-04 DIAGNOSIS — Z1231 Encounter for screening mammogram for malignant neoplasm of breast: Secondary | ICD-10-CM

## 2017-07-27 ENCOUNTER — Ambulatory Visit
Admission: RE | Admit: 2017-07-27 | Discharge: 2017-07-27 | Disposition: A | Discharge status: Home / Self Care | Payer: Medicare HMO | Source: Ambulatory Visit | Attending: Obstetrics & Gynecology | Admitting: Obstetrics & Gynecology

## 2017-07-27 DIAGNOSIS — Z1231 Encounter for screening mammogram for malignant neoplasm of breast: Secondary | ICD-10-CM | POA: Diagnosis not present

## 2017-07-31 ENCOUNTER — Other Ambulatory Visit: Payer: Self-pay | Admitting: Obstetrics & Gynecology

## 2017-07-31 DIAGNOSIS — R921 Mammographic calcification found on diagnostic imaging of breast: Secondary | ICD-10-CM

## 2017-08-03 ENCOUNTER — Other Ambulatory Visit: Payer: Self-pay | Admitting: Obstetrics & Gynecology

## 2017-08-03 ENCOUNTER — Ambulatory Visit
Admission: RE | Admit: 2017-08-03 | Discharge: 2017-08-03 | Disposition: A | Discharge status: Home / Self Care | Payer: Medicare HMO | Source: Ambulatory Visit | Attending: Obstetrics & Gynecology | Admitting: Obstetrics & Gynecology

## 2017-08-03 DIAGNOSIS — R921 Mammographic calcification found on diagnostic imaging of breast: Secondary | ICD-10-CM

## 2017-08-10 ENCOUNTER — Ambulatory Visit
Admission: RE | Admit: 2017-08-10 | Discharge: 2017-08-10 | Disposition: A | Discharge status: Home / Self Care | Payer: Medicare HMO | Source: Ambulatory Visit | Attending: Obstetrics & Gynecology | Admitting: Obstetrics & Gynecology

## 2017-08-10 DIAGNOSIS — R921 Mammographic calcification found on diagnostic imaging of breast: Secondary | ICD-10-CM

## 2017-08-10 DIAGNOSIS — D0511 Intraductal carcinoma in situ of right breast: Secondary | ICD-10-CM | POA: Diagnosis not present

## 2017-08-17 ENCOUNTER — Ambulatory Visit: Payer: Self-pay | Admitting: General Surgery

## 2017-08-17 DIAGNOSIS — D0511 Intraductal carcinoma in situ of right breast: Secondary | ICD-10-CM | POA: Diagnosis not present

## 2017-08-18 ENCOUNTER — Encounter: Payer: Self-pay | Admitting: Radiation Oncology

## 2017-08-18 ENCOUNTER — Encounter: Payer: Self-pay | Admitting: Oncology

## 2017-08-18 ENCOUNTER — Telehealth: Payer: Self-pay | Admitting: Oncology

## 2017-08-18 NOTE — Telephone Encounter (Signed)
Appt has been scheduled for the pt to see Dr. Jana Hakim on 5/8 at 4pm. Pt aware to arrive 30 minutes early. Letter and directions mailed.

## 2017-08-21 NOTE — Progress Notes (Signed)
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  Histology per Pathology Report:  08/10/2017 IMMUNOHISTOCHEMICAL AND MORPHOMETRIC ANALYSIS PERFORMED MANUALLY Estrogen Receptor: 100%, POSITIVE, STRONG STAINING INTENSITY Progesterone Receptor: 60%, POSITIVE, STRONG STAINING INTENSITY REFERENCE RANGE ESTROGEN RECEPTOR NEGATIVE 0% POSITIVE =>1% REFERENCE RANGE PROGESTERONE RECEPTOR NEGATIVE 0% POSITIVE =>1% All controls stained appropriately  Receptor Status: ER(100%), PR (60%),   Did patient present with symptoms (if so, please note symptoms) or was this found on screening mammography?:  A routine mammography screening found a 53mm area of abnormal calcification in the upper outer quadrant of the right breast  Past/Anticipated interventions by surgeon, if any: Having lumpectomy tomorrow 08/31/17 byDr. Marlou Starks  Past/Anticipated interventions by medical oncology, if any: Chemotherapy  Has appointment with Dr. Jana Hakim 08/30/17  Lymphedema issues, if any:  None  Pain issues, if any:  None  SAFETY ISSUES:  Prior radiation? No  Pacemaker/ICD? No  Possible current pregnancy? No  Is the patient on methotrexate? No  Current Complaints / other details:  Tiffany Dillon presents today with her husband for her radiation consult. Reports feeling very anxious and worried about upcoming treatment plan, but is eager to find out what her treatment team has planned out. Denies any new signs/symtoms. Her and her husband are unsure about whether or not they want to pursue radiation as a course of treatment.    Tiffany Button, RN 08/21/2017,2:31 PM

## 2017-08-23 ENCOUNTER — Other Ambulatory Visit: Payer: Self-pay | Admitting: General Surgery

## 2017-08-23 DIAGNOSIS — D0511 Intraductal carcinoma in situ of right breast: Secondary | ICD-10-CM

## 2017-08-25 ENCOUNTER — Other Ambulatory Visit: Payer: Self-pay

## 2017-08-25 ENCOUNTER — Encounter (HOSPITAL_COMMUNITY): Payer: Self-pay

## 2017-08-25 ENCOUNTER — Encounter (HOSPITAL_COMMUNITY)
Admission: RE | Admit: 2017-08-25 | Discharge: 2017-08-25 | Disposition: A | Discharge status: Home / Self Care | Payer: Medicare HMO | Source: Ambulatory Visit | Attending: General Surgery | Admitting: General Surgery

## 2017-08-25 DIAGNOSIS — Z01818 Encounter for other preprocedural examination: Secondary | ICD-10-CM | POA: Insufficient documentation

## 2017-08-25 DIAGNOSIS — D0511 Intraductal carcinoma in situ of right breast: Secondary | ICD-10-CM | POA: Diagnosis not present

## 2017-08-25 LAB — CBC
HCT: 43.1 % (ref 36.0–46.0)
Hemoglobin: 14.2 g/dL (ref 12.0–15.0)
MCH: 32.2 pg (ref 26.0–34.0)
MCHC: 32.9 g/dL (ref 30.0–36.0)
MCV: 97.7 fL (ref 78.0–100.0)
Platelets: 270 10*3/uL (ref 150–400)
RBC: 4.41 MIL/uL (ref 3.87–5.11)
RDW: 12.9 % (ref 11.5–15.5)
WBC: 4.6 10*3/uL (ref 4.0–10.5)

## 2017-08-25 NOTE — Pre-Procedure Instructions (Signed)
[place]  08/25/2017      [facility] [address], [place] [address] [place] [address] Phone: [phone] Fax: [phone]    Your procedure is scheduled on 08/31/2017.  Report to [facility] Admitting at [place].  Call this number if you have problems the morning of surgery:  [phone]   Remember:  Do not eat food or drink liquids after midnight.   Continue all medications as directed by your physician except follow these medication instructions before surgery below   Take these medicines the morning of surgery with A SIP OF WATER:  NONE  7 days prior to surgery STOP taking any Aspirin (unless otherwise instructed by your surgeon), Aleve, Naproxen, Ibuprofen, Motrin, Advil, Goody's, BC's, all herbal medications, fish oil, and all vitamins  Follow your doctors instructions regarding your Aspirin.  If no instructions were given by your doctor, then you will need to call the prescribing office office to get instructions.      Do not wear jewelry, make-up or nail polish.  Do not wear lotions, powders, or perfumes, or deodorant.  Do not shave 48 hours prior to surgery.  Men may shave face and neck.  Do not bring valuables to the hospital.  [facility] is not responsible for any belongings or valuables.  Hearing aids, eyeglasses, contacts, dentures or bridgework may not be worn into surgery.  Leave your suitcase in the car.  After surgery it may be brought to your room.  For patients admitted to the hospital, discharge time will be determined by your treatment team.  Patients discharged the day of surgery will not be allowed to drive home.   Name and phone number of your driver:    Special instructions:   [facility]- Preparing For Surgery  Before surgery, you can play an important role. Because skin is not sterile, your skin needs to be as free of germs as possible. You can reduce the number of germs on your  skin by washing with CHG (chlorahexidine gluconate) Soap before surgery.  CHG is an antiseptic cleaner which kills germs and bonds with the skin to continue killing germs even after washing.  Please do not use if you have an allergy to CHG or antibacterial soaps. If your skin becomes reddened/irritated stop using the CHG.  Do not shave (including legs and underarms) for at least 48 hours prior to first CHG shower. It is OK to shave your face.  Please follow these instructions carefully.   1. Shower the NIGHT BEFORE SURGERY and the MORNING OF SURGERY with CHG.   2. If you chose to wash your hair, wash your hair first as usual with your normal shampoo.  3. After you shampoo, rinse your hair and body thoroughly to remove the shampoo.  4. Use CHG as you would any other liquid soap. You can apply CHG directly to the skin and wash gently with a scrungie or a clean washcloth.   5. Apply the CHG Soap to your body ONLY FROM THE NECK DOWN.  Do not use on open wounds or open sores. Avoid contact with your eyes, ears, mouth and genitals (private parts). Wash Face and genitals (private parts)  with your normal soap.  6. Wash thoroughly, paying special attention to the area where your surgery will be performed.  7. Thoroughly rinse your body with warm water from the neck down.  8. DO NOT shower/wash with your normal  soap after using and rinsing off the CHG Soap.  9. Pat yourself dry with a CLEAN TOWEL.  10. Wear CLEAN PAJAMAS to bed the night before surgery, wear comfortable clothes the morning of surgery  11. Place CLEAN SHEETS on your bed the night of your first shower and DO NOT SLEEP WITH PETS.    Day of Surgery: Shower as stated above. Do not apply any deodorants/lotions.  Please wear clean clothes to the hospital/surgery center.      Please read over the following fact sheets that you were given.

## 2017-08-25 NOTE — Progress Notes (Signed)
PCP - Dr. Evette Doffing Cardiologist - patient denies  Chest x-ray - n/a EKG - n/a Stress Test - patient denies  ECHO - patient denies Cardiac Cath - patient denies  Sleep Study - patient denies  Aspirin Instructions: patient instructed to stop 3 days prior to sugery  Anesthesia review: n/a  Patient denies shortness of breath, fever, cough and chest pain at PAT appointment   Patient verbalized understanding of instructions that were given to them at the PAT appointment. Patient was also instructed that they will need to review over the PAT instructions again at home before surgery.

## 2017-08-29 ENCOUNTER — Ambulatory Visit
Admission: RE | Admit: 2017-08-29 | Discharge: 2017-08-29 | Disposition: A | Discharge status: Home / Self Care | Payer: Medicare HMO | Source: Ambulatory Visit | Attending: General Surgery | Admitting: General Surgery

## 2017-08-29 DIAGNOSIS — D0511 Intraductal carcinoma in situ of right breast: Secondary | ICD-10-CM
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
## 2017-08-30 ENCOUNTER — Encounter: Payer: Self-pay | Admitting: General Practice

## 2017-08-30 ENCOUNTER — Other Ambulatory Visit: Payer: Self-pay

## 2017-08-30 ENCOUNTER — Encounter (HOSPITAL_COMMUNITY): Payer: Self-pay | Admitting: Anesthesiology

## 2017-08-30 ENCOUNTER — Inpatient Hospital Stay: Payer: Medicare HMO | Attending: Oncology | Admitting: Oncology

## 2017-08-30 ENCOUNTER — Encounter: Payer: Self-pay | Admitting: Radiation Oncology

## 2017-08-30 ENCOUNTER — Ambulatory Visit
Admission: RE | Admit: 2017-08-30 | Discharge: 2017-08-30 | Disposition: A | Discharge status: Home / Self Care | Payer: Medicare HMO | Source: Ambulatory Visit | Attending: Radiation Oncology | Admitting: Radiation Oncology

## 2017-08-30 VITALS — BP 151/77 | HR 68 | Temp 98.2°F | Resp 18 | Ht 68.0 in | Wt 155.8 lb

## 2017-08-30 DIAGNOSIS — Z800 Family history of malignant neoplasm of digestive organs: Secondary | ICD-10-CM | POA: Diagnosis not present

## 2017-08-30 DIAGNOSIS — Z79899 Other long term (current) drug therapy: Secondary | ICD-10-CM | POA: Insufficient documentation

## 2017-08-30 DIAGNOSIS — D0511 Intraductal carcinoma in situ of right breast: Secondary | ICD-10-CM | POA: Diagnosis not present

## 2017-08-30 DIAGNOSIS — Z170 Estrogen receptor positive status [ER+]: Secondary | ICD-10-CM | POA: Diagnosis not present

## 2017-08-30 DIAGNOSIS — Z7982 Long term (current) use of aspirin: Secondary | ICD-10-CM | POA: Diagnosis not present

## 2017-08-30 DIAGNOSIS — M8580 Other specified disorders of bone density and structure, unspecified site: Secondary | ICD-10-CM | POA: Diagnosis not present

## 2017-08-30 DIAGNOSIS — C50411 Malignant neoplasm of upper-outer quadrant of right female breast: Secondary | ICD-10-CM

## 2017-08-30 DIAGNOSIS — M1990 Unspecified osteoarthritis, unspecified site: Secondary | ICD-10-CM | POA: Insufficient documentation

## 2017-08-30 DIAGNOSIS — Z803 Family history of malignant neoplasm of breast: Secondary | ICD-10-CM | POA: Diagnosis not present

## 2017-08-30 DIAGNOSIS — E7800 Pure hypercholesterolemia, unspecified: Secondary | ICD-10-CM | POA: Diagnosis not present

## 2017-08-30 NOTE — Progress Notes (Signed)
[place] Psychosocial Distress Screening Clinical Social Work  Clinical Social Work was referred by distress screening protocol.  The patient scored a 9 on the Psychosocial Distress Thermometer which indicates severe distress. Clinical Social Worker Edwyna Shell to assess for distress and other psychosocial needs. Left VM, unable to reach patient at either home or mobile number.  Left detailed message regarding Support Center services, offered to mail packet.    ONCBCN DISTRESS SCREENING 08/30/2017  Screening Type Initial Screening  Distress experienced in past week (1-10) 9  Practical problem type Work/school  Emotional problem type Nervousness/Anxiety  Information Concerns Type Lack of info about treatment  Physical Problem type Sleep/insomnia    Clinical Social Worker follow up needed: Yes.    If yes, follow up plan: Await patient call.   Edwyna Shell, LCSW Clinical Social Worker Phone:  [phone]

## 2017-08-30 NOTE — Anesthesia Preprocedure Evaluation (Addendum)
Anesthesia Evaluation  Patient identified by MRN, date of birth, ID band Patient awake    Reviewed: Allergy & Precautions, NPO status , Patient's Chart, lab work & pertinent test results  Airway Mallampati: I       Dental no notable dental hx.    Pulmonary neg pulmonary ROS,    Pulmonary exam normal breath sounds clear to auscultation       Cardiovascular negative cardio ROS Normal cardiovascular exam Rhythm:Regular Rate:Normal     Neuro/Psych negative psych ROS   GI/Hepatic negative GI ROS, Neg liver ROS,   Endo/Other  negative endocrine ROS  Renal/GU negative Renal ROS  negative genitourinary   Musculoskeletal   Abdominal Normal abdominal exam  (+)   Peds  Hematology negative hematology ROS (+)   Anesthesia Other Findings   Reproductive/Obstetrics                            Anesthesia Physical Anesthesia Plan  ASA: II  Anesthesia Plan: General   Post-op Pain Management:    Induction:   PONV Risk Score and Plan: 3 and Ondansetron, Dexamethasone and Midazolam  Airway Management Planned: LMA  Additional Equipment:   Intra-op Plan:   Post-operative Plan:   Informed Consent: I have reviewed the patients History and Physical, chart, labs and discussed the procedure including the risks, benefits and alternatives for the proposed anesthesia with the patient or authorized representative who has indicated his/her understanding and acceptance.   Dental advisory given  Plan Discussed with: CRNA and Surgeon  Anesthesia Plan Comments:        Anesthesia Quick Evaluation
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
## 2017-08-31 ENCOUNTER — Ambulatory Visit (HOSPITAL_COMMUNITY)
Admission: RE | Admit: 2017-08-31 | Discharge: 2017-08-31 | Disposition: A | Discharge status: Home / Self Care | Payer: Medicare HMO | Source: Ambulatory Visit | Attending: General Surgery | Admitting: General Surgery

## 2017-08-31 ENCOUNTER — Ambulatory Visit
Admission: RE | Admit: 2017-08-31 | Discharge: 2017-08-31 | Disposition: A | Discharge status: Home / Self Care | Payer: Medicare HMO | Source: Ambulatory Visit | Attending: General Surgery | Admitting: General Surgery

## 2017-08-31 ENCOUNTER — Encounter (HOSPITAL_COMMUNITY): Payer: Self-pay

## 2017-08-31 ENCOUNTER — Telehealth: Payer: Self-pay | Admitting: Oncology

## 2017-08-31 ENCOUNTER — Encounter (HOSPITAL_COMMUNITY)
Admission: RE | Disposition: A | Discharge status: Home / Self Care | Payer: Self-pay | Source: Ambulatory Visit | Attending: General Surgery

## 2017-08-31 ENCOUNTER — Encounter: Payer: Self-pay | Admitting: *Deleted

## 2017-08-31 ENCOUNTER — Ambulatory Visit (HOSPITAL_COMMUNITY): Payer: Medicare HMO | Admitting: Anesthesiology

## 2017-08-31 DIAGNOSIS — E7800 Pure hypercholesterolemia, unspecified: Secondary | ICD-10-CM | POA: Insufficient documentation

## 2017-08-31 DIAGNOSIS — N6011 Diffuse cystic mastopathy of right breast: Secondary | ICD-10-CM | POA: Insufficient documentation

## 2017-08-31 DIAGNOSIS — Z79899 Other long term (current) drug therapy: Secondary | ICD-10-CM | POA: Insufficient documentation

## 2017-08-31 DIAGNOSIS — Z8249 Family history of ischemic heart disease and other diseases of the circulatory system: Secondary | ICD-10-CM | POA: Insufficient documentation

## 2017-08-31 DIAGNOSIS — R921 Mammographic calcification found on diagnostic imaging of breast: Secondary | ICD-10-CM | POA: Diagnosis not present

## 2017-08-31 DIAGNOSIS — Z803 Family history of malignant neoplasm of breast: Secondary | ICD-10-CM | POA: Diagnosis not present

## 2017-08-31 DIAGNOSIS — D0511 Intraductal carcinoma in situ of right breast: Secondary | ICD-10-CM | POA: Insufficient documentation

## 2017-08-31 DIAGNOSIS — Z882 Allergy status to sulfonamides status: Secondary | ICD-10-CM | POA: Diagnosis not present

## 2017-08-31 DIAGNOSIS — Z885 Allergy status to narcotic agent status: Secondary | ICD-10-CM | POA: Diagnosis not present

## 2017-08-31 DIAGNOSIS — Z86010 Personal history of colonic polyps: Secondary | ICD-10-CM | POA: Insufficient documentation

## 2017-08-31 DIAGNOSIS — Z7982 Long term (current) use of aspirin: Secondary | ICD-10-CM | POA: Insufficient documentation

## 2017-08-31 HISTORY — PX: BREAST LUMPECTOMY WITH RADIOACTIVE SEED LOCALIZATION: SHX6424

## 2017-08-31 SURGERY — BREAST LUMPECTOMY WITH RADIOACTIVE SEED LOCALIZATION
Anesthesia: General | Site: Breast | Laterality: Right

## 2017-08-31 MED ORDER — ACETAMINOPHEN 325 MG PO TABS: 325.0000 mg | ORAL_TABLET | ORAL | Status: DC | PRN

## 2017-08-31 MED ORDER — DEXAMETHASONE SODIUM PHOSPHATE 10 MG/ML IJ SOLN
INTRAMUSCULAR | Status: AC
  Filled 2017-08-31: qty 1

## 2017-08-31 MED ORDER — LACTATED RINGERS IV SOLN
INTRAVENOUS | Status: DC | PRN
  Administered 2017-08-31: 07:00:00 via INTRAVENOUS

## 2017-08-31 MED ORDER — DEXAMETHASONE SODIUM PHOSPHATE 10 MG/ML IJ SOLN
INTRAMUSCULAR | Status: DC | PRN
  Administered 2017-08-31: 10 mg via INTRAVENOUS

## 2017-08-31 MED ORDER — ACETAMINOPHEN 500 MG PO TABS
1000.0000 mg | ORAL_TABLET | ORAL | Status: AC
  Administered 2017-08-31: 1000 mg via ORAL
  Filled 2017-08-31: qty 2

## 2017-08-31 MED ORDER — MIDAZOLAM HCL 5 MG/5ML IJ SOLN
INTRAMUSCULAR | Status: DC | PRN
  Administered 2017-08-31: 2 mg via INTRAVENOUS

## 2017-08-31 MED ORDER — KETOROLAC TROMETHAMINE 15 MG/ML IJ SOLN: 15.0000 mg | Freq: Once | INTRAMUSCULAR | Status: DC

## 2017-08-31 MED ORDER — CHLORHEXIDINE GLUCONATE CLOTH 2 % EX PADS: 6.0000 | MEDICATED_PAD | Freq: Once | CUTANEOUS | Status: DC

## 2017-08-31 MED ORDER — MEPERIDINE HCL 50 MG/ML IJ SOLN: 6.2500 mg | INTRAMUSCULAR | Status: DC | PRN

## 2017-08-31 MED ORDER — 0.9 % SODIUM CHLORIDE (POUR BTL) OPTIME
TOPICAL | Status: DC | PRN
  Administered 2017-08-31: 1000 mL

## 2017-08-31 MED ORDER — ONDANSETRON HCL 4 MG/2ML IJ SOLN: 4.0000 mg | Freq: Once | INTRAMUSCULAR | Status: DC | PRN

## 2017-08-31 MED ORDER — ROCURONIUM BROMIDE 50 MG/5ML IV SOLN
INTRAVENOUS | Status: AC
  Filled 2017-08-31: qty 1

## 2017-08-31 MED ORDER — BUPIVACAINE-EPINEPHRINE 0.25% -1:200000 IJ SOLN
INTRAMUSCULAR | Status: DC | PRN
  Administered 2017-08-31: 20 mL

## 2017-08-31 MED ORDER — ACETAMINOPHEN 160 MG/5ML PO SOLN: 325.0000 mg | ORAL | Status: DC | PRN

## 2017-08-31 MED ORDER — ONDANSETRON HCL 4 MG/2ML IJ SOLN
INTRAMUSCULAR | Status: DC | PRN
  Administered 2017-08-31: 4 mg via INTRAVENOUS

## 2017-08-31 MED ORDER — EPHEDRINE SULFATE 50 MG/ML IJ SOLN
INTRAMUSCULAR | Status: AC
  Filled 2017-08-31: qty 1

## 2017-08-31 MED ORDER — LIDOCAINE 2% (20 MG/ML) 5 ML SYRINGE
INTRAMUSCULAR | Status: DC | PRN
  Administered 2017-08-31: 100 mg via INTRAVENOUS

## 2017-08-31 MED ORDER — LIDOCAINE 2% (20 MG/ML) 5 ML SYRINGE
INTRAMUSCULAR | Status: AC
  Filled 2017-08-31: qty 5

## 2017-08-31 MED ORDER — PROPOFOL 10 MG/ML IV BOLUS
INTRAVENOUS | Status: DC | PRN
  Administered 2017-08-31: 100 mg via INTRAVENOUS

## 2017-08-31 MED ORDER — SODIUM CHLORIDE 0.9 % IJ SOLN
INTRAMUSCULAR | Status: AC
  Filled 2017-08-31: qty 10

## 2017-08-31 MED ORDER — EPHEDRINE SULFATE 50 MG/ML IJ SOLN
INTRAMUSCULAR | Status: DC | PRN
  Administered 2017-08-31 (×2): 5 mg via INTRAVENOUS
  Administered 2017-08-31: 10 mg via INTRAVENOUS
  Administered 2017-08-31: 5 mg via INTRAVENOUS

## 2017-08-31 MED ORDER — FENTANYL CITRATE (PF) 100 MCG/2ML IJ SOLN: 25.0000 ug | INTRAMUSCULAR | Status: DC | PRN

## 2017-08-31 MED ORDER — CEFAZOLIN SODIUM-DEXTROSE 2-4 GM/100ML-% IV SOLN
2.0000 g | INTRAVENOUS | Status: AC
  Administered 2017-08-31: 2 g via INTRAVENOUS
  Filled 2017-08-31: qty 100

## 2017-08-31 MED ORDER — FENTANYL CITRATE (PF) 250 MCG/5ML IJ SOLN
INTRAMUSCULAR | Status: AC
  Filled 2017-08-31: qty 5

## 2017-08-31 MED ORDER — ONDANSETRON HCL 4 MG/2ML IJ SOLN
INTRAMUSCULAR | Status: AC
  Filled 2017-08-31: qty 2

## 2017-08-31 MED ORDER — PROPOFOL 10 MG/ML IV BOLUS
INTRAVENOUS | Status: AC
  Filled 2017-08-31: qty 40

## 2017-08-31 MED ORDER — GABAPENTIN 300 MG PO CAPS
300.0000 mg | ORAL_CAPSULE | ORAL | Status: AC
  Administered 2017-08-31: 300 mg via ORAL
  Filled 2017-08-31: qty 1

## 2017-08-31 MED ORDER — FENTANYL CITRATE (PF) 250 MCG/5ML IJ SOLN
INTRAMUSCULAR | Status: DC | PRN
  Administered 2017-08-31: 50 ug via INTRAVENOUS

## 2017-08-31 MED ORDER — MIDAZOLAM HCL 2 MG/2ML IJ SOLN
INTRAMUSCULAR | Status: AC
  Filled 2017-08-31: qty 2

## 2017-08-31 MED ORDER — BUPIVACAINE-EPINEPHRINE (PF) 0.25% -1:200000 IJ SOLN
INTRAMUSCULAR | Status: AC
  Filled 2017-08-31: qty 30

## 2017-08-31 MED ORDER — TRAMADOL HCL 50 MG PO TABS: 50.0000 mg | ORAL_TABLET | Freq: Four times a day (QID) | ORAL | 0 refills | Status: DC | PRN

## 2017-08-31 SURGICAL SUPPLY — 40 items
ADH SKN CLS APL DERMABOND .7 (GAUZE/BANDAGES/DRESSINGS) ×1
APPLIER CLIP 9.375 MED OPEN (MISCELLANEOUS)
APR CLP MED 9.3 20 MLT OPN (MISCELLANEOUS)
BINDER BREAST LRG (GAUZE/BANDAGES/DRESSINGS) ×1 IMPLANT
BLADE SURG 15 STRL LF DISP TIS (BLADE) ×1 IMPLANT
BLADE SURG 15 STRL SS (BLADE) ×2
CANISTER SUCT 3000ML PPV (MISCELLANEOUS) ×2 IMPLANT
CHLORAPREP W/TINT 26ML (MISCELLANEOUS) ×2 IMPLANT
CLIP APPLIE 9.375 MED OPEN (MISCELLANEOUS) IMPLANT
COVER PROBE W GEL 5X96 (DRAPES) ×2 IMPLANT
COVER SURGICAL LIGHT HANDLE (MISCELLANEOUS) ×2 IMPLANT
DERMABOND ADVANCED (GAUZE/BANDAGES/DRESSINGS) ×1
DERMABOND ADVANCED .7 DNX12 (GAUZE/BANDAGES/DRESSINGS) ×1 IMPLANT
DEVICE DUBIN SPECIMEN MAMMOGRA (MISCELLANEOUS) ×2 IMPLANT
DRAPE CHEST BREAST 15X10 FENES (DRAPES) ×2 IMPLANT
DRAPE UTILITY XL STRL (DRAPES) ×2 IMPLANT
ELECT COATED BLADE 2.86 ST (ELECTRODE) ×2 IMPLANT
ELECT REM PT RETURN 9FT ADLT (ELECTROSURGICAL) ×2
ELECTRODE REM PT RTRN 9FT ADLT (ELECTROSURGICAL) ×1 IMPLANT
GLOVE BIO SURGEON STRL SZ7.5 (GLOVE) ×4 IMPLANT
GOWN STRL REUS W/ TWL LRG LVL3 (GOWN DISPOSABLE) ×2 IMPLANT
GOWN STRL REUS W/TWL LRG LVL3 (GOWN DISPOSABLE) ×4
KIT BASIN OR (CUSTOM PROCEDURE TRAY) ×2 IMPLANT
KIT MARKER MARGIN INK (KITS) ×2 IMPLANT
LIGHT WAVEGUIDE WIDE FLAT (MISCELLANEOUS) IMPLANT
NDL HYPO 25GX1X1/2 BEV (NEEDLE) ×1 IMPLANT
NEEDLE HYPO 25GX1X1/2 BEV (NEEDLE) ×2 IMPLANT
NS IRRIG 1000ML POUR BTL (IV SOLUTION) ×2 IMPLANT
PACK SURGICAL SETUP 50X90 (CUSTOM PROCEDURE TRAY) ×2 IMPLANT
PENCIL BUTTON HOLSTER BLD 10FT (ELECTRODE) ×2 IMPLANT
SPONGE LAP 18X18 X RAY DECT (DISPOSABLE) ×2 IMPLANT
SUT MNCRL AB 4-0 PS2 18 (SUTURE) ×2 IMPLANT
SUT SILK 2 0 SH (SUTURE) IMPLANT
SUT VIC AB 3-0 SH 18 (SUTURE) ×2 IMPLANT
SYR BULB 3OZ (MISCELLANEOUS) ×2 IMPLANT
SYR CONTROL 10ML LL (SYRINGE) ×2 IMPLANT
TOWEL OR 17X24 6PK STRL BLUE (TOWEL DISPOSABLE) ×2 IMPLANT
TOWEL OR 17X26 10 PK STRL BLUE (TOWEL DISPOSABLE) ×2 IMPLANT
TUBE CONNECTING 12X1/4 (SUCTIONS) ×2 IMPLANT
YANKAUER SUCT BULB TIP NO VENT (SUCTIONS) ×2 IMPLANT

## 2017-08-31 NOTE — Telephone Encounter (Signed)
Per 5/8 no los °

## 2017-08-31 NOTE — Interval H&P Note (Signed)
History and Physical Interval Note:  08/31/2017 7:12 AM  Tiffany Dillon  has presented today for surgery, with the diagnosis of right breast ductal carcinoma in situ  The various methods of treatment have been discussed with the patient and family. After consideration of risks, benefits and other options for treatment, the patient has consented to  Procedure(s): RIGHT BREAST LUMPECTOMY WITH RADIOACTIVE SEED LOCALIZATION ERAS PATHWAY (Right) as a surgical intervention .  The patient's history has been reviewed, patient examined, no change in status, stable for surgery.  I have reviewed the patient's chart and labs.  Questions were answered to the patient's satisfaction.     TOTH III,PAUL S

## 2017-08-31 NOTE — Op Note (Signed)
08/31/2017  8:27 AM  PATIENT:  Tiffany Dillon  67 y.o. female  PRE-OPERATIVE DIAGNOSIS:  right breast ductal carcinoma in situ  POST-OPERATIVE DIAGNOSIS:  right breast ductal carcinoma in situ  PROCEDURE:  Procedure(s): RIGHT BREAST LUMPECTOMY WITH RADIOACTIVE SEED LOCALIZATION ERAS PATHWAY (Right)  SURGEON:  Surgeon(s) and Role:    * Jovita Kussmaul, MD - Primary  PHYSICIAN ASSISTANT:   ASSISTANTS: none   ANESTHESIA:   local and general  EBL:  minimal   BLOOD ADMINISTERED:none  DRAINS: none   LOCAL MEDICATIONS USED:  MARCAINE     SPECIMEN:  Source of Specimen:  right breast tissue  DISPOSITION OF SPECIMEN:  PATHOLOGY  COUNTS:  YES  TOURNIQUET:  * No tourniquets in log *  DICTATION: .Dragon Dictation   After informed consent was obtained the patient was brought to the operating room and placed in the supine position on the operating room table.  After adequate induction of general anesthesia the patient's right breast was prepped with ChloraPrep, allowed to dry, and draped in usual sterile manner.  An appropriate timeout was performed.  Previously an I-125 seed was placed in the upper outer quadrant of the right breast to mark an area of ductal carcinoma in situ.  The neoprobe was set to I-125 in the area of radioactivity was readily identified.  The area around this was infiltrated with quarter percent Marcaine.  A curvilinear incision was made along the upper outer edge of the areole of the right breast with a 15 blade knife.  The incision was carried through the skin and subcutaneous tissue sharply with electrocautery.  Next the breast was separated from the subcutaneous tissue by a combination of blunt hemostat dissection and some sharp dissection with electrocautery in the upper outer quadrant.  Once we were well beyond where the area of DCIS was we then excised a circular portion of breast tissue sharply around the radioactive seed while checking the radioactivity  frequently with the neoprobe.  Once the specimen was removed it was oriented with the appropriate paint colors.  A specimen radiograph was obtained that showed the clip and seed to be in the center of the specimen.  The specimen was then sent to pathology for further evaluation.  Hemostasis was achieved using the Bovie electrocautery.  The wound was then irrigated with copious amounts of saline and infiltrated with more quarter percent Marcaine.  The cavity was marked with clips.  The deep layer of the wound was then closed with layers of interrupted 3-0 Vicryl stitches.  The skin was then closed with interrupted 4-0 Monocryl subcuticular stitches.  Dermabond dressings were applied.  The patient tolerated the procedure well.  At the end of the case all needle sponge and instrument counts were correct.  The patient was then awakened and taken to recovery in stable condition.  PLAN OF CARE: Discharge to home after PACU  PATIENT DISPOSITION:  PACU - hemodynamically stable.   Delay start of Pharmacological VTE agent (>24hrs) due to surgical blood loss or risk of bleeding: not applicable

## 2017-08-31 NOTE — Anesthesia Procedure Notes (Signed)
Procedure Name: LMA Insertion Date/Time: 08/31/2017 7:33 AM Performed by: Valda Favia, CRNA Pre-anesthesia Checklist: Patient identified, Emergency Drugs available, Suction available and Patient being monitored Patient Re-evaluated:Patient Re-evaluated prior to induction Oxygen Delivery Method: Circle System Utilized Preoxygenation: Pre-oxygenation with 100% oxygen Induction Type: IV induction LMA: LMA inserted LMA Size: 4.0 Number of attempts: 1 Placement Confirmation: positive ETCO2 Tube secured with: Tape Dental Injury: Teeth and Oropharynx as per pre-operative assessment

## 2017-08-31 NOTE — Transfer of Care (Signed)
Immediate Anesthesia Transfer of Care Note  Patient: Tiffany Dillon  Procedure(s) Performed: RIGHT BREAST LUMPECTOMY WITH RADIOACTIVE SEED LOCALIZATION ERAS PATHWAY (Right Breast)  Patient Location: PACU  Anesthesia Type:General  Level of Consciousness: drowsy  Airway & Oxygen Therapy: Patient Spontanous Breathing and Patient connected to nasal cannula oxygen  Post-op Assessment: Report given to RN and Post -op Vital signs reviewed and stable  Post vital signs: Reviewed and stable  Last Vitals:  Vitals Value Taken Time  BP 108/50 08/31/2017  8:41 AM  Temp 36.5 C 08/31/2017  8:39 AM  Pulse 81 08/31/2017  8:45 AM  Resp 23 08/31/2017  8:45 AM  SpO2 100 % 08/31/2017  8:45 AM  Vitals shown include unvalidated device data.  Last Pain:  Vitals:   08/31/17 0839  TempSrc:   PainSc: 0-No pain      Patients Stated Pain Goal: 3 (10/18/92 8546)  Complications: No apparent anesthesia complications

## 2017-08-31 NOTE — Anesthesia Postprocedure Evaluation (Signed)
Anesthesia Post Note  Patient: Tiffany Dillon  Procedure(s) Performed: RIGHT BREAST LUMPECTOMY WITH RADIOACTIVE SEED LOCALIZATION ERAS PATHWAY (Right Breast)     Patient location during evaluation: PACU Anesthesia Type: General Level of consciousness: awake Pain management: pain level controlled Vital Signs Assessment: post-procedure vital signs reviewed and stable Respiratory status: spontaneous breathing Cardiovascular status: stable Postop Assessment: no apparent nausea or vomiting Anesthetic complications: no    Last Vitals:  Vitals:   08/31/17 0845 08/31/17 0900  BP: (!) 109/54 (!) 110/57  Pulse: 80 72  Resp: 16 17  Temp:    SpO2: 100% 100%    Last Pain:  Vitals:   08/31/17 0839  TempSrc:   PainSc: 0-No pain   Pain Goal: Patients Stated Pain Goal: 3 (08/31/17 0548)               Corina Stacy JR,JOHN Mateo Flow

## 2017-08-31 NOTE — H&P (Signed)
Tiffany Dillon  Location: [facility] Patient #: [id number] DOB: 1950-07-03 Married / Language: English / Race: White Female   History of Present Illness  The patient is a 67 year old female who presents for a follow-up for Breast cancer. We are asked to see the patient in consultation by Dr. Ammie Ferrier to evaluate her for a new right breast cancer. The patient is a 67 year old white female who recently went for a routine screening mammogram. At that time she was found to have a 6 mm area of abnormal calcification in the upper outer quadrant of the right breast. 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 She denies any breast pain or discharge from the nipple. Her only family history of breast cancer is in a maternal cousin. She has done some reading and is very concerned about radiation therapy. She does not smoke. She is otherwise in great health.   Past Surgical History  Bypass Surgery for Poor Blood Flow to Legs  Colon Polyp Removal - Open   Diagnostic Studies History Mammogram  within last year  Allergies No Known Drug Allergies  Allergies Reconciled   Medication History  Vitamin C (1000MG  Tablet, Oral) Active. Aspirin (81MG  Tablet, Oral) Active. Multi-Vitamin (Oral) Active. Estradiol (0.5MG  Tablet, Oral) Active. Medications Reconciled  Social History  Caffeine use  Coffee, Tea. No alcohol use  Tobacco use  Never smoker.  Family History  Heart Disease  Father, Mother. Kidney Disease  Brother. Malignant Neoplasm Of Pancreas  Mother. Prostate Cancer  Father. Thyroid problems  Daughter.  Pregnancy / Birth History  Age at menarche  62 years. Gravida  2 Para  1  Other Problems  Hypercholesterolemia     Review of Systems  General Not Present- Appetite Loss, Chills, Fatigue, Fever, Night Sweats, Weight Gain and Weight  Loss. Skin Not Present- Change in Wart/Mole, Dryness, Hives, Jaundice, New Lesions, Non-Healing Wounds, Rash and Ulcer. HEENT Present- Wears glasses/contact lenses. Not Present- Earache, Hearing Loss, Hoarseness, Nose Bleed, Oral Ulcers, Ringing in the Ears, Seasonal Allergies, Sinus Pain, Sore Throat, Visual Disturbances and Yellow Eyes. Respiratory Not Present- Bloody sputum, Chronic Cough, Difficulty Breathing, Snoring and Wheezing. Breast Not Present- Breast Mass, Breast Pain, Nipple Discharge and Skin Changes. Cardiovascular Not Present- Chest Pain, Difficulty Breathing Lying Down, Leg Cramps, Palpitations, Rapid Heart Rate, Shortness of Breath and Swelling of Extremities. Gastrointestinal Not Present- Abdominal Pain, Bloating, Bloody Stool, Change in Bowel Habits, Chronic diarrhea, Constipation, Difficulty Swallowing, Excessive gas, Gets full quickly at meals, Hemorrhoids, Indigestion, Nausea, Rectal Pain and Vomiting. Musculoskeletal Not Present- Back Pain, Joint Pain, Joint Stiffness, Muscle Pain, Muscle Weakness and Swelling of Extremities. Neurological Not Present- Decreased Memory, Fainting, Headaches, Numbness, Seizures, Tingling, Tremor, Trouble walking and Weakness. Psychiatric Not Present- Anxiety, Bipolar, Change in Sleep Pattern, Depression, Fearful and Frequent crying. Endocrine Not Present- Cold Intolerance, Excessive Hunger, Hair Changes, Heat Intolerance, Hot flashes and New Diabetes. Hematology Not Present- Blood Thinners, Easy Bruising, Excessive bleeding, Gland problems, HIV and Persistent Infections.  Vitals  Weight: 155.6 lb Height: 68in Body Surface Area: 1.84 m Body Mass Index: 23.66 kg/m  Temp.: 98.74F  Pulse: 70 (Regular)  BP: 110/72 (Sitting, Left Arm, Standard)       Physical Exam General Mental Status-Alert. General Appearance-Consistent with stated age. Hydration-Well hydrated. Voice-Normal.  Head and Neck Head-normocephalic,  atraumatic with no lesions or palpable masses. Trachea-midline. Thyroid Gland Characteristics - normal size and consistency.  Eye Eyeball - Bilateral-Extraocular movements intact. Sclera/Conjunctiva - Bilateral-No scleral icterus.  Chest and Lung Exam Chest and lung exam reveals -quiet, even and easy respiratory effort with no use of accessory muscles and on auscultation, normal breath sounds, no adventitious sounds and normal vocal resonance. Inspection Chest Wall - Normal. Back - normal.  Breast Note: There is no palpable mass in either breast. There is no palpable axillary, supraclavicular, or cervical lymphadenopathy.   Cardiovascular Cardiovascular examination reveals -normal heart sounds, regular rate and rhythm with no murmurs and normal pedal pulses bilaterally.  Abdomen Inspection Inspection of the abdomen reveals - No Hernias. Skin - Scar - no surgical scars. Palpation/Percussion Palpation and Percussion of the abdomen reveal - Soft, Non Tender, No Rebound tenderness, No Rigidity (guarding) and No hepatosplenomegaly. Auscultation Auscultation of the abdomen reveals - Bowel sounds normal.  Neurologic Neurologic evaluation reveals -alert and oriented x 3 with no impairment of recent or remote memory. Mental Status-Normal.  Musculoskeletal Normal Exam - Left-Upper Extremity Strength Normal and Lower Extremity Strength Normal. Normal Exam - Right-Upper Extremity Strength Normal and Lower Extremity Strength Normal.  Lymphatic Head & Neck  General Head & Neck Lymphatics: Bilateral - Description - Normal. Axillary  General Axillary Region: Bilateral - Description - Normal. Tenderness - Non Tender. Femoral & Inguinal  Generalized Femoral & Inguinal Lymphatics: Bilateral - Description - Normal. Tenderness - Non Tender.    Assessment & Plan  DUCTAL CARCINOMA IN SITU (DCIS) OF RIGHT BREAST (D05.11) Impression: The patient appears to have a small 6  mm area of DCIS in the upper outer right breast. We have discussed in detail the different options for treatment including the COMET trial and at this point she favors breast conservation. I think this is a reasonable way of treating her cancer. She will need a right breast radioactive seed localized lumpectomy. I have discussed with her in detail the risks and benefits of the operation as well as some of the technical aspects and she understands and wishes to proceed. I will go ahead and make a referral to medical and radiation oncology to discuss adjuvant therapy. I have also advised her to stop using the vaginal cream for now. Current Plans Referred to Oncology, for evaluation and follow up (Oncology). Routine. Pt Education - Breast Cancer: discussed with patient and provided information.

## 2017-09-01 ENCOUNTER — Encounter (HOSPITAL_COMMUNITY): Payer: Self-pay | Admitting: General Surgery

## 2017-09-05 ENCOUNTER — Telehealth: Payer: Self-pay | Admitting: Radiation Oncology

## 2017-09-05 ENCOUNTER — Telehealth: Payer: Self-pay | Admitting: *Deleted

## 2017-09-05 NOTE — Telephone Encounter (Signed)
Called patient to inform of Rcon. Appt. on May 22 @ 8 am for an 8:30 am appt., lvm for a return call

## 2017-09-05 NOTE — Telephone Encounter (Signed)
Patient called in and request appt to be r/s to a different date from 5/22 to possibly 0/23 due to conflicting appt. Message routed to Radiation onc.

## 2017-09-06 ENCOUNTER — Telehealth: Payer: Self-pay | Admitting: *Deleted

## 2017-09-06 NOTE — Telephone Encounter (Signed)
Called patient to inform of Rcon appt. On 09-12-17 - 2 pm- nurse and 2:30 pm- dr., spoke with patient's husband - Tiffany Dillon and he is aware of this appt.

## 2017-09-10 HISTORY — PX: BREAST LUMPECTOMY: SHX2

## 2017-09-12 ENCOUNTER — Ambulatory Visit
Admission: RE | Admit: 2017-09-12 | Discharge: 2017-09-12 | Disposition: A | Discharge status: Home / Self Care | Payer: Medicare HMO | Source: Ambulatory Visit | Attending: Radiation Oncology | Admitting: Radiation Oncology

## 2017-09-12 ENCOUNTER — Other Ambulatory Visit: Payer: Self-pay

## 2017-09-12 ENCOUNTER — Encounter: Payer: Self-pay | Admitting: Radiation Oncology

## 2017-09-12 VITALS — BP 137/75 | HR 80 | Temp 98.2°F | Resp 18 | Ht 68.0 in | Wt 152.6 lb

## 2017-09-12 DIAGNOSIS — C50411 Malignant neoplasm of upper-outer quadrant of right female breast: Secondary | ICD-10-CM

## 2017-09-12 DIAGNOSIS — Z170 Estrogen receptor positive status [ER+]: Principal | ICD-10-CM

## 2017-09-12 DIAGNOSIS — Z9889 Other specified postprocedural states: Secondary | ICD-10-CM | POA: Diagnosis not present

## 2017-09-12 DIAGNOSIS — D0511 Intraductal carcinoma in situ of right breast: Secondary | ICD-10-CM | POA: Diagnosis not present

## 2017-09-12 NOTE — Progress Notes (Signed)
Tiffany Dillon presents today with her husband for a reconsult with Dr. Sondra Come. She recently had a lumpectomy to the right breast by Dr. Marlou Starks on 08/31/17. Denies any pain or discomfort at the incisional site. Denies any swelling or issues with range of motion. All in all appears in good spirits, but is still unsure about whether or not she'd would like to pursue radiation as a course of treatment.    Vitals:   09/12/17 1402  BP: 137/75  Pulse: 80  Resp: 18  Temp: 98.2 F (36.8 C)  TempSrc: Oral  SpO2: 100%  Weight: 152 lb 9.6 oz (69.2 kg)  Height: 5\' 8"  (1.727 m)   Wt Readings from Last 3 Encounters:  09/12/17 152 lb 9.6 oz (69.2 kg)  08/31/17 155 lb 3.2 oz (70.4 kg)  08/30/17 155 lb 12.8 oz (70.7 kg)

## 2017-09-12 NOTE — Progress Notes (Signed)
Radiation Oncology         [phone] ________________________________  Name: Tiffany Dillon MRN: [id number]  Date: 09/12/2017  DOB: [birth year]  Follow-Up Visit Note  CC: Eustaquio Maize, MD  Jovita Kussmaul, MD    ICD-10-CM   1. Malignant neoplasm of upper-outer quadrant of right breast in female, estrogen receptor positive ([place]) C50.411    Z17.0     Diagnosis:  DUCTAL CARCINOMA IN SITU, INTERMEDIATE GRADE WITH FOCAL NECROSIS PRESENTING IN THE RIGHT BREAST, er POSITIVE, pr POSITIVE  Narrative:  The patient returns today for FURTHER EVALUATION. sINCE HER INITIAL CONSULTATION 08/30/2017 PATIENT HAS UNDERGONE HER DEFINITIVE SURGERY WITH A  seed GUIDED LUMPECTOMY UNDER THE DIRECTION OF dR. TOTH. tHE PATIENT WAS FOUND TO HAVE A 1.5 CM INTERMEDIATE GRADE DUCTAL CARCINOMA IN SITU. tHERE WAS FOCAL AREAS OF NECROSIS. nO INVASIVE TUMOR NOTED. tHE SURGICAL MARGINS WERE CLEAR WITH THE ANTERIOR AND posterior  MARGINS BEING 10 MM. pATIENT IS BEEN DOING WELL SINCE HER SURGERY. sHE NOW PRESENTS FOR FURTHER DISCUSSION OF radiation THERAPY AS PART OF HER OVERALL MANAGEMENT.    ALLERGIES:  is allergic to bactrim [sulfamethoxazole-trimethoprim] and codeine.  Meds: Current Outpatient Medications  Medication Sig Dispense Refill  . aspirin 81 MG tablet Take 81 mg by mouth See admin instructions. Take 81 mg 5 times a week    . Cholecalciferol (VITAMIN D3) LIQD Take 2,000-3,000 Units by mouth daily.    . Multiple Vitamin (MULTIVITAMIN) tablet Take 1 tablet by mouth daily.    . Omega-3 Fatty Acids (FISH OIL) 500 MG CAPS Take 500 mg by mouth daily.    Marland Kitchen OVER THE COUNTER MEDICATION Take 1 capsule by mouth every other day. Blood builder otc supplement    . Probiotic CAPS Take 1 capsule by mouth daily.    . traMADol (ULTRAM) 50 MG tablet Take 1-2 tablets (50-100 mg total) by mouth every 6 (six) hours as needed. 20 tablet 0  . vitamin C (ASCORBIC ACID) 500 MG tablet Take 500 mg by mouth daily.      No current facility-administered medications for this encounter.     Physical Findings: The patient is in no acute distress. Patient is alert and oriented.  height is '5\' 8"'  (1.727 m) and weight is 152 lb 9.6 oz (69.2 kg). Her oral temperature is 98.2 F (36.8 C). Her blood pressure is 137/75 and her pulse is 80. Her respiration is 18 and oxygen saturation is 100%. .   Lungs are clear to auscultation bilaterally. Heart has regular rate and rhythm. No palpable cervical, supraclavicular, or axillary adenopathy. Abdomen soft, non-tender, normal bowel sounds.  The right breast area shows a periareolar scar which is healing well without signs of drainage or infection.            No dominant mass appreciated in the breast nipple discharge or bleeding  Lab Findings: Lab Results  Component Value Date   WBC 4.6 08/25/2017   HGB 14.2 08/25/2017   HCT 43.1 08/25/2017   MCV 97.7 08/25/2017   PLT 270 08/25/2017    Radiographic Findings: Mm Breast Surgical Specimen  Result Date: 08/31/2017 CLINICAL DATA:  67 year old patient had radioactive seed localization of the right breast on 08/29/2017 prior to lumpectomy for ductal carcinoma in situ. EXAM: SPECIMEN RADIOGRAPH OF THE RIGHT BREAST COMPARISON:  Previous exam(s). FINDINGS: Status post excision of the right breast. The radioactive seed and biopsy marker clip are present, completely intact, and were marked for pathology. IMPRESSION: Specimen radiograph of  the right breast. Electronically Signed   By: Curlene Dolphin M.D.   On: 08/31/2017 08:14   Mm Rt Radioactive Seed Loc Mammo Guide  Result Date: 08/29/2017 CLINICAL DATA:  The patient presents for seed localization prior to RIGHT lumpectomy for intermediate grade ductal carcinoma in situ in the UPPER-OUTER QUADRANT. EXAM: MAMMOGRAPHIC GUIDED RADIOACTIVE SEED LOCALIZATION OF THE RIGHT BREAST COMPARISON:  Previous exam(s). FINDINGS: Patient presents for radioactive seed localization prior to lumpectomy. I  met with the patient and we discussed the procedure of seed localization including benefits and alternatives. We discussed the high likelihood of a successful procedure. We discussed the risks of the procedure including infection, bleeding, tissue injury and further surgery. We discussed the low dose of radioactivity involved in the procedure. Informed, written consent was given. The usual time-out protocol was performed immediately prior to the procedure. Using mammographic guidance, sterile technique, 1% lidocaine and an I-125 radioactive seed, the coil shaped clip in the UPPER-OUTER QUADRANT of the RIGHT breast was localized using a superior to inferior approach. The follow-up mammogram images confirm the seed in the expected location and were marked for Dr. Marlou Starks. Follow-up survey of the patient confirms presence of the radioactive seed. Order number of I-125 seed:  [id number]. Total activity:  7.209 millicuries reference Date: 08/08/2017 The patient tolerated the procedure well and was released from the [facility]. She was given instructions regarding seed removal. IMPRESSION: Radioactive seed localization right breast. No apparent complications. Electronically Signed   By: Nolon Nations M.D.   On: 08/29/2017 13:24    Impression:   DUCTAL CARCINOMA IN SITU, INTERMEDIATE GRADE WITH FOCAL NECROSIS PRESENTING IN THE RIGHT BREAST, er POSITIVE, pr POSITIVE. GIVEN THE FINDINGS OF INTERMEDIATE GRADE WITH FOCAL NECROSIS AND HER AGE WOULD RECOMMEND ADJUVANT RADIATION THERAPY AS PART OF PATIENT'S OVERALL MANAGEMENT. I DISCUSSED COURSE OF TREATMENT SIDE EFFECTS AND POTENTIAL TOXICITIES OF BREAST RADIATION THERAPY WITH THE PATIENT. sHE APPEARS to [facility] AND WISHES TO PROCEED WITH PLANNED COURSE OF TREATMENT.  Plan: PATIENT WILL BE SCHEDULED FOR ct SIMULATION APPROXIMATELY 4- 5 WEEKS OUT FROM HER SURGERY WITH TREATMENTS TO BEGIN APPROXIMATELY 6 WEEKS POSTOP. The patient will meet with Dr. Jana Hakim later this  summer concerning adjuvant hormonal therapy. At This point patient is unsure whether she will proceed with this as part of her overall management.  ____________________________________ Gery Pray, MD

## 2017-09-13 ENCOUNTER — Ambulatory Visit: Admission: RE | Admit: 2017-09-13 | Payer: Medicare HMO | Source: Ambulatory Visit | Admitting: Radiation Oncology

## 2017-09-13 ENCOUNTER — Ambulatory Visit: Payer: Medicare HMO | Attending: Radiation Oncology

## 2017-09-19 ENCOUNTER — Ambulatory Visit (INDEPENDENT_AMBULATORY_CARE_PROVIDER_SITE_OTHER): Payer: Medicare HMO | Admitting: Family Medicine

## 2017-09-19 VITALS — BP 119/70 | HR 88 | Temp 97.6°F | Ht 68.0 in | Wt 154.0 lb

## 2017-09-19 DIAGNOSIS — N3001 Acute cystitis with hematuria: Secondary | ICD-10-CM | POA: Diagnosis not present

## 2017-09-19 DIAGNOSIS — R300 Dysuria: Secondary | ICD-10-CM | POA: Diagnosis not present

## 2017-09-19 LAB — URINALYSIS, COMPLETE
BILIRUBIN UA: NEGATIVE
GLUCOSE, UA: NEGATIVE
KETONES UA: NEGATIVE
Nitrite, UA: NEGATIVE
PROTEIN UA: NEGATIVE
Urobilinogen, Ur: 0.2 mg/dL (ref 0.2–1.0)
pH, UA: 6.5 (ref 5.0–7.5)

## 2017-09-19 LAB — MICROSCOPIC EXAMINATION: RENAL EPITHEL UA: NONE SEEN /HPF

## 2017-09-19 MED ORDER — PHENAZOPYRIDINE HCL 200 MG PO TABS: 200.0000 mg | ORAL_TABLET | Freq: Three times a day (TID) | ORAL | 0 refills | Status: AC | PRN

## 2017-09-19 MED ORDER — CEPHALEXIN 500 MG PO CAPS: 500.0000 mg | ORAL_CAPSULE | Freq: Two times a day (BID) | ORAL | 0 refills | Status: AC

## 2017-09-19 NOTE — Patient Instructions (Signed)

## 2017-09-19 NOTE — Progress Notes (Signed)
Subjective: CC: UTI PCP: Eustaquio Maize, MD KGM:[id number] Tiffany Dillon is a 67 y.o. female presenting to clinic today for:  1. Urinary symptoms Patient reports a couple day h/o dysuria, urinary odor .  Denies urgency, hematuria, fevers, chills, abdominal pain, nausea, vomiting, back pain, vaginal discharge.  Patient has used nothing for symptoms.  Patient denies a h/o frequent or recurrent UTIs.      ROS: Per HPI  Allergies  Allergen Reactions  . Bactrim [Sulfamethoxazole-Trimethoprim] Rash  . Codeine Other (See Comments)    Feels bad   Past Medical History:  Diagnosis Date  . Anemia   . Arthritis   . Cataract   . Elevated cholesterol    slightly  . Seizure [facility])    h/o prior to starting menses    Current Outpatient Medications:  .  aspirin 81 MG tablet, Take 81 mg by mouth See admin instructions. Take 81 mg 5 times a week, Disp: , Rfl:  .  Cholecalciferol (VITAMIN D3) LIQD, Take 2,000-3,000 Units by mouth daily., Disp: , Rfl:  .  Multiple Vitamin (MULTIVITAMIN) tablet, Take 1 tablet by mouth daily., Disp: , Rfl:  .  Omega-3 Fatty Acids (FISH OIL) 500 MG CAPS, Take 500 mg by mouth daily., Disp: , Rfl:  .  OVER THE COUNTER MEDICATION, Take 1 capsule by mouth every other day. Blood builder otc supplement, Disp: , Rfl:  .  Probiotic CAPS, Take 1 capsule by mouth daily., Disp: , Rfl:  .  traMADol (ULTRAM) 50 MG tablet, Take 1-2 tablets (50-100 mg total) by mouth every 6 (six) hours as needed., Disp: 20 tablet, Rfl: 0 .  vitamin C (ASCORBIC ACID) 500 MG tablet, Take 500 mg by mouth daily., Disp: , Rfl:  Social History   Socioeconomic History  . Marital status: Married    Spouse name: Not on file  . Number of children: Not on file  . Years of education: Not on file  . Highest education level: Not on file  Occupational History  . Not on file  Social Needs  . Financial resource strain: Not on file  . Food insecurity:    Worry: Not on file    Inability: Not on file    . Transportation needs:    Medical: Not on file    Non-medical: Not on file  Tobacco Use  . Smoking status: Never Smoker  . Smokeless tobacco: Never Used  Substance and Sexual Activity  . Alcohol use: No    Alcohol/week: 0.0 oz  . Drug use: No  . Sexual activity: Yes    Partners: Male    Birth control/protection: Other-see comments, Post-menopausal    Comment: vasectomy  Lifestyle  . Physical activity:    Days per week: Not on file    Minutes per session: Not on file  . Stress: Not on file  Relationships  . Social connections:    Talks on phone: Not on file    Gets together: Not on file    Attends religious service: Not on file    Active member of club or organization: Not on file    Attends meetings of clubs or organizations: Not on file    Relationship status: Not on file  . Intimate partner violence:    Fear of current or ex partner: Not on file    Emotionally abused: Not on file    Physically abused: Not on file    Forced sexual activity: Not on file  Other Topics Concern  .  Not on file  Social History Narrative  . Not on file   Family History  Problem Relation Age of Onset  . Pancreatic cancer Mother   . Hypertension Mother   . Heart disease Mother   . Prostate cancer Father   . Heart disease Father   . Breast cancer Cousin   . Colon cancer Neg Hx   . Esophageal cancer Neg Hx   . Rectal cancer Neg Hx   . Stomach cancer Neg Hx     Objective: Office vital signs reviewed. BP 119/70   Pulse 88   Temp 97.6 F (36.4 C) (Oral)   Ht 5\' 8"  (1.727 m)   Wt 154 lb (69.9 kg)   LMP 04/25/1998   BMI 23.42 kg/m   Physical Examination:  General: Awake, alert, well nourished, No acute distress HEENT: MMM,sclera white GU: no CVA TTP  Assessment/ Plan: 67 y.o. female   1. Acute cystitis with hematuria Urinalysis with white blood cells and blood.  Urine microscopy with moderate amounts of white blood cells, red blood cells and bacteria.  I sent this for urine  culture.  Patient is currently afebrile with normal vital signs.  Unlikely to have pyelonephritis at this point given lack of symptoms and other systemic signs.  2017 urine culture was reviewed which demonstrated E. coli that was sensitive to cephalosporins.  Keflex 500 mg p.o. twice daily for the next 5 days prescribed.  Pyridium 200 mg p.o. 3 times daily as needed pain prescribed.  Push oral fluids.  Home care instructions reviewed and handout was provided.  Follow-up as needed. - Urinalysis, Complete - Urine Culture   Orders Placed This Encounter  Procedures  . Urine Culture  . Urinalysis, Complete   Meds ordered this encounter  Medications  . cephALEXin (KEFLEX) 500 MG capsule    Sig: Take 1 capsule (500 mg total) by mouth 2 (two) times daily for 5 days.    Dispense:  10 capsule    Refill:  0  . phenazopyridine (PYRIDIUM) 200 MG tablet    Sig: Take 1 tablet (200 mg total) by mouth 3 (three) times daily as needed for up to 2 days for pain.    Dispense:  6 tablet    Refill:  0     [name], DO [place] [phone]

## 2017-09-20 ENCOUNTER — Institutional Professional Consult (permissible substitution): Payer: Medicare HMO | Admitting: Radiation Oncology

## 2017-09-21 ENCOUNTER — Encounter: Payer: Self-pay | Admitting: Family

## 2017-09-21 ENCOUNTER — Ambulatory Visit (INDEPENDENT_AMBULATORY_CARE_PROVIDER_SITE_OTHER): Payer: Medicare HMO | Admitting: Family

## 2017-09-21 ENCOUNTER — Telehealth: Payer: Self-pay | Admitting: *Deleted

## 2017-09-21 VITALS — BP 126/71 | HR 81 | Temp 97.9°F | Ht 68.0 in | Wt 156.8 lb

## 2017-09-21 DIAGNOSIS — H109 Unspecified conjunctivitis: Secondary | ICD-10-CM

## 2017-09-21 DIAGNOSIS — J3489 Other specified disorders of nose and nasal sinuses: Secondary | ICD-10-CM | POA: Diagnosis not present

## 2017-09-21 LAB — URINE CULTURE

## 2017-09-21 MED ORDER — AZITHROMYCIN 1 % OP SOLN: 1.0000 [drp] | Freq: Every day | OPHTHALMIC | 0 refills | Status: DC

## 2017-09-21 MED ORDER — OFLOXACIN 0.3 % OP SOLN: 1.0000 [drp] | Freq: Three times a day (TID) | OPHTHALMIC | 0 refills | Status: DC

## 2017-09-21 NOTE — Patient Instructions (Signed)

## 2017-09-21 NOTE — Progress Notes (Signed)
   Subjective:    Patient ID: Tiffany Dillon, female    DOB: [birth year], 67 y.o.   MRN: [id number]  Chief Complaint  Patient presents with  . pink color in eye with matting   PT presents to the office today with right eye redness and discharge. States her daughter came this weekend to visit and had "pink eye" and tried to be careful.  Conjunctivitis   The current episode started yesterday. The onset was sudden. The problem has been unchanged. The problem is mild. The symptoms are relieved by rest. Nothing aggravates the symptoms. Associated symptoms include rhinorrhea, eye discharge and eye redness. Pertinent negatives include no eye itching, no ear discharge, no ear pain and no eye pain. The right eye is affected.      Review of Systems  HENT: Positive for rhinorrhea. Negative for ear discharge and ear pain.   Eyes: Positive for discharge and redness. Negative for pain and itching.  All other systems reviewed and are negative.      Objective:   Physical Exam  Constitutional: She is oriented to person, place, and time. She appears well-developed and well-nourished. No distress.  HENT:  Head: Normocephalic and atraumatic.  Nose: Mucosal edema present.  Eyes: Pupils are equal, round, and reactive to light. Right eye exhibits discharge.  Neck: Normal range of motion. Neck supple. No thyromegaly present.  Cardiovascular: Normal rate, regular rhythm, normal heart sounds and intact distal pulses.  No murmur heard. Pulmonary/Chest: Effort normal and breath sounds normal. No respiratory distress. She has no wheezes.  Abdominal: Soft. Bowel sounds are normal. She exhibits no distension. There is no tenderness.  Musculoskeletal: Normal range of motion. She exhibits no edema or tenderness.  Neurological: She is alert and oriented to person, place, and time. She has normal reflexes. No cranial nerve deficit.  Skin: Skin is warm and dry.  Psychiatric: She has a normal mood and affect.  Her behavior is normal. Judgment and thought content normal.  Vitals reviewed.     BP 126/71   Pulse 81   Temp 97.9 F (36.6 C) (Oral)   Ht 5\' 8"  (1.727 m)   Wt 156 lb 12.8 oz (71.1 kg)   LMP 04/25/1998   BMI 23.84 kg/m      Assessment & Plan:  Alexei was seen today for pink color in eye with matting.  Diagnoses and all orders for this visit:  Bacterial conjunctivitis of right eye -     azithromycin (AZASITE) 1 % ophthalmic solution; Place 1 drop into the right eye daily. For 5 days  Rhinorrhea   Rest Force fluids  Good hand hygiene  RTO if symptoms worsen or do not improve  Evelina Dun, FNP

## 2017-09-21 NOTE — Telephone Encounter (Signed)
Fax received [facility] Azithromycin ophthalmic solution $211.33 w/ ins Please advise on alternative

## 2017-09-21 NOTE — Telephone Encounter (Signed)
Ofloxacin Prescription sent to pharmacy   

## 2017-10-09 ENCOUNTER — Ambulatory Visit (INDEPENDENT_AMBULATORY_CARE_PROVIDER_SITE_OTHER): Payer: Medicare HMO | Admitting: Pediatrics

## 2017-10-09 ENCOUNTER — Encounter: Payer: Self-pay | Admitting: Pediatrics

## 2017-10-09 VITALS — BP 130/75 | HR 80 | Temp 97.7°F | Ht 68.0 in | Wt 153.4 lb

## 2017-10-09 DIAGNOSIS — Z170 Estrogen receptor positive status [ER+]: Secondary | ICD-10-CM

## 2017-10-09 DIAGNOSIS — N390 Urinary tract infection, site not specified: Secondary | ICD-10-CM | POA: Diagnosis not present

## 2017-10-09 DIAGNOSIS — C50411 Malignant neoplasm of upper-outer quadrant of right female breast: Secondary | ICD-10-CM | POA: Diagnosis not present

## 2017-10-09 DIAGNOSIS — N952 Postmenopausal atrophic vaginitis: Secondary | ICD-10-CM | POA: Diagnosis not present

## 2017-10-09 NOTE — Patient Instructions (Signed)
Increase fluid intake Can try replens feminine moisturizing product for vaginal dryness

## 2017-10-09 NOTE — Progress Notes (Signed)
  Subjective:   Patient ID: Tiffany Dillon, female    DOB: 04-Feb-1951, 67 y.o.   MRN: [id number] CC: Second Opinion  HPI: Tiffany Dillon is a 67 y.o. female   Husband recently died on 2022-10-03 after stay in ICU.  She missed scheduled start date for radiation for breast cancer because of this.  She has some hesitation about radiation in the antiestrogen pills and possible side effects.  She has gotten 3 different opinions about how to proceed from oncology, radiation oncology, surgery.    She is most worried about recent urinary tract infection.  Being on the vaginal estrogen replacement prior to recent diagnosis of breast cancer, she had noticed decrease in rate of urinary tract infections.  Ongoing stress from recent loss.  Has good support at home from family and friends.  Relevant past medical, surgical, family and social history reviewed. Allergies and medications reviewed and updated. Social History   Tobacco Use  Smoking Status Never Smoker  Smokeless Tobacco Never Used   ROS: Per HPI   Objective:    BP 130/75   Pulse 80   Temp 97.7 F (36.5 C) (Oral)   Ht 5\' 8"  (1.727 m)   Wt 153 lb 6.4 oz (69.6 kg)   LMP 04/25/1998   BMI 23.32 kg/m   Wt Readings from Last 3 Encounters:  10/09/17 153 lb 6.4 oz (69.6 kg)  09/21/17 156 lb 12.8 oz (71.1 kg)  09/19/17 154 lb (69.9 kg)    Gen: NAD, alert, cooperative with exam, NCAT EYES: EOMI, no conjunctival injection, or no icterus ENT: OP without erythema LYMPH: no cervical LAD CV: NRRR, normal S1/S2, no murmur, distal pulses 2+ b/l Resp: CTABL, no wheezes, normal WOB Abd: +BS, soft, NTND. no guarding or organomegaly Ext: No edema, warm Neuro: Alert and oriented, strength equal b/l UE and LE, coordination grossly normal MSK: normal muscle bulk  Assessment & Plan:  Tiffany Dillon was seen today for second opinion.  Diagnoses and all orders for this visit:  Atrophic vaginitis Discussed symptom relief. Okay to try  feminine moisturizing products  Malignant neoplasm of upper-outer quadrant of right breast in female, estrogen receptor positive [facility]) Patient with questions re next therapy.  She will get back into talk with her medical oncologist.  Frequent urinary tract infections  Increase water intake, empty bladder frequently.  Has had 2 UTIs in the last 6 months.  Continue to monitor for now.  I spent 25 minutes with the patient with over 50% of the encounter time dedicated to counseling on the above problems.  Follow up plan: Return in about 3 months (around 01/09/2018). Tiffany Found, MD [place]

## 2017-10-10 ENCOUNTER — Other Ambulatory Visit: Payer: Self-pay | Admitting: Oncology

## 2017-10-11 ENCOUNTER — Inpatient Hospital Stay: Payer: Medicare HMO

## 2017-10-11 ENCOUNTER — Encounter: Payer: Medicare HMO | Admitting: Genetic Counselor

## 2017-10-11 ENCOUNTER — Inpatient Hospital Stay: Payer: Medicare HMO | Attending: Oncology | Admitting: Genetic Counselor

## 2017-10-11 ENCOUNTER — Other Ambulatory Visit: Payer: Medicare HMO

## 2017-10-11 ENCOUNTER — Encounter: Payer: Self-pay | Admitting: Genetic Counselor

## 2017-10-11 DIAGNOSIS — Z801 Family history of malignant neoplasm of trachea, bronchus and lung: Secondary | ICD-10-CM | POA: Insufficient documentation

## 2017-10-11 DIAGNOSIS — Z803 Family history of malignant neoplasm of breast: Secondary | ICD-10-CM | POA: Insufficient documentation

## 2017-10-11 DIAGNOSIS — Z1379 Encounter for other screening for genetic and chromosomal anomalies: Secondary | ICD-10-CM

## 2017-10-11 DIAGNOSIS — M1990 Unspecified osteoarthritis, unspecified site: Secondary | ICD-10-CM | POA: Insufficient documentation

## 2017-10-11 DIAGNOSIS — Z7982 Long term (current) use of aspirin: Secondary | ICD-10-CM | POA: Insufficient documentation

## 2017-10-11 DIAGNOSIS — Z170 Estrogen receptor positive status [ER+]: Secondary | ICD-10-CM | POA: Insufficient documentation

## 2017-10-11 DIAGNOSIS — D0511 Intraductal carcinoma in situ of right breast: Secondary | ICD-10-CM | POA: Insufficient documentation

## 2017-10-11 DIAGNOSIS — C50411 Malignant neoplasm of upper-outer quadrant of right female breast: Secondary | ICD-10-CM

## 2017-10-11 DIAGNOSIS — E7800 Pure hypercholesterolemia, unspecified: Secondary | ICD-10-CM | POA: Insufficient documentation

## 2017-10-11 DIAGNOSIS — Z800 Family history of malignant neoplasm of digestive organs: Secondary | ICD-10-CM | POA: Insufficient documentation

## 2017-10-11 DIAGNOSIS — Z8042 Family history of malignant neoplasm of prostate: Secondary | ICD-10-CM

## 2017-10-11 DIAGNOSIS — Z79899 Other long term (current) drug therapy: Secondary | ICD-10-CM | POA: Insufficient documentation
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
## 2017-10-13 ENCOUNTER — Other Ambulatory Visit: Payer: Self-pay | Admitting: Oncology
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
## 2017-10-20 ENCOUNTER — Inpatient Hospital Stay: Payer: Medicare HMO | Admitting: Oncology

## 2017-10-20 VITALS — BP 141/68 | HR 88 | Temp 98.2°F | Resp 18 | Ht 68.0 in | Wt 156.3 lb

## 2017-10-20 DIAGNOSIS — Z170 Estrogen receptor positive status [ER+]: Secondary | ICD-10-CM | POA: Diagnosis not present

## 2017-10-20 DIAGNOSIS — Z801 Family history of malignant neoplasm of trachea, bronchus and lung: Secondary | ICD-10-CM | POA: Diagnosis not present

## 2017-10-20 DIAGNOSIS — M1990 Unspecified osteoarthritis, unspecified site: Secondary | ICD-10-CM | POA: Diagnosis not present

## 2017-10-20 DIAGNOSIS — Z79899 Other long term (current) drug therapy: Secondary | ICD-10-CM

## 2017-10-20 DIAGNOSIS — Z7982 Long term (current) use of aspirin: Secondary | ICD-10-CM | POA: Diagnosis not present

## 2017-10-20 DIAGNOSIS — E7800 Pure hypercholesterolemia, unspecified: Secondary | ICD-10-CM

## 2017-10-20 DIAGNOSIS — D0511 Intraductal carcinoma in situ of right breast: Secondary | ICD-10-CM | POA: Diagnosis not present

## 2017-10-20 DIAGNOSIS — Z8042 Family history of malignant neoplasm of prostate: Secondary | ICD-10-CM

## 2017-10-20 DIAGNOSIS — Z800 Family history of malignant neoplasm of digestive organs: Secondary | ICD-10-CM | POA: Diagnosis not present

## 2017-10-20 DIAGNOSIS — C50411 Malignant neoplasm of upper-outer quadrant of right female breast: Secondary | ICD-10-CM

## 2017-10-20 DIAGNOSIS — Z803 Family history of malignant neoplasm of breast: Secondary | ICD-10-CM | POA: Diagnosis not present

## 2017-10-30 ENCOUNTER — Ambulatory Visit
Admission: RE | Admit: 2017-10-30 | Discharge: 2017-10-30 | Disposition: A | Discharge status: Home / Self Care | Payer: Medicare HMO | Source: Ambulatory Visit | Attending: Radiation Oncology | Admitting: Radiation Oncology

## 2017-10-30 DIAGNOSIS — Z510 Encounter for antineoplastic radiation therapy: Secondary | ICD-10-CM | POA: Diagnosis present

## 2017-10-30 DIAGNOSIS — Z170 Estrogen receptor positive status [ER+]: Secondary | ICD-10-CM | POA: Insufficient documentation

## 2017-10-30 DIAGNOSIS — D0511 Intraductal carcinoma in situ of right breast: Secondary | ICD-10-CM | POA: Diagnosis not present

## 2017-10-30 DIAGNOSIS — C50411 Malignant neoplasm of upper-outer quadrant of right female breast: Secondary | ICD-10-CM | POA: Diagnosis not present

## 2017-10-30 NOTE — Progress Notes (Addendum)
  Radiation Oncology         [phone] ________________________________  Name: Tiffany Dillon MRN: [id number]  Date: 10/30/2017  DOB: June 13, 1950  SIMULATION AND TREATMENT PLANNING NOTE    ICD-10-CM   1. Malignant neoplasm of upper-outer quadrant of right breast in female, estrogen receptor positive ([place]) C50.411    Z17.0     DIAGNOSIS:  67 y.o. female with ductal carcinoma in situ, intermediate grade with focal necrosis presenting in the right breast, ER positive, PR positive  NARRATIVE:  The patient was brought to the [facility].  Identity was confirmed.  All relevant records and images related to the planned course of therapy were reviewed.  The patient freely provided informed written consent to proceed with treatment after reviewing the details related to the planned course of therapy. The consent form was witnessed and verified by the simulation staff.  Then, the patient was set-up in a stable reproducible  supine position for radiation therapy.  CT images were obtained.  Surface markings were placed.  The CT images were loaded into the planning software.  Then the target and avoidance structures were contoured.  Treatment planning then occurred.  The radiation prescription was entered and confirmed.  Then, I designed and supervised the construction of a total of 3 medically necessary complex treatment devices.  I have requested : 3D Simulation  I have requested a DVH of the following structures: heart, lungs, lumpectomy cavity.  I have ordered:dose calc.  PLAN:  The patient will receive 42.72 Gy in 16 fractions. No boost is planned in light of 1 cm margin on lumpectomy specimen.   Optical Surface Tracking Plan:  Since intensity modulated radiotherapy (IMRT) and 3D conformal radiation treatment methods are predicated on accurate and precise positioning for treatment, intrafraction motion monitoring is medically necessary to ensure accurate and safe treatment  delivery.  The ability to quantify intrafraction motion without excessive ionizing radiation dose can only be performed with optical surface tracking. Accordingly, surface imaging offers the opportunity to obtain 3D measurements of patient position throughout IMRT and 3D treatments without excessive radiation exposure.  I am ordering optical surface tracking for this patient's upcoming course of radiotherapy. ________________________________   ----------------------------------  Blair Promise, PhD, MD  This document serves as a record of services personally performed by Gery Pray, MD. It was created on his behalf by Rae Lips, a trained medical scribe. The creation of this record is based on the scribe's personal observations and the provider's statements to them. This document has been checked and approved by the attending provider.

## 2017-10-31 NOTE — Addendum Note (Signed)
Encounter addended by: Gery Pray, MD on: 10/31/2017 7:53 PM  Actions taken: Sign clinical note

## 2017-11-02 DIAGNOSIS — D0511 Intraductal carcinoma in situ of right breast: Secondary | ICD-10-CM | POA: Diagnosis not present

## 2017-11-02 DIAGNOSIS — Z510 Encounter for antineoplastic radiation therapy: Secondary | ICD-10-CM | POA: Diagnosis not present

## 2017-11-04 ENCOUNTER — Ambulatory Visit (INDEPENDENT_AMBULATORY_CARE_PROVIDER_SITE_OTHER): Payer: Medicare HMO | Admitting: Family Medicine

## 2017-11-04 ENCOUNTER — Encounter: Payer: Self-pay | Admitting: Family Medicine

## 2017-11-04 VITALS — BP 112/67 | HR 86 | Temp 98.2°F | Ht 68.0 in | Wt 155.8 lb

## 2017-11-04 DIAGNOSIS — S3091XA Unspecified superficial injury of lower back and pelvis, initial encounter: Secondary | ICD-10-CM

## 2017-11-04 DIAGNOSIS — L089 Local infection of the skin and subcutaneous tissue, unspecified: Secondary | ICD-10-CM | POA: Diagnosis not present

## 2017-11-04 DIAGNOSIS — T148XXA Other injury of unspecified body region, initial encounter: Principal | ICD-10-CM

## 2017-11-04 DIAGNOSIS — L700 Acne vulgaris: Secondary | ICD-10-CM

## 2017-11-04 MED ORDER — DOXYCYCLINE HYCLATE 100 MG PO CAPS
100.0000 mg | ORAL_CAPSULE | Freq: Two times a day (BID) | ORAL | 0 refills | Status: DC
Start: 2017-11-04 — End: 2018-01-10

## 2017-11-04 NOTE — Progress Notes (Signed)
Chief Complaint  Patient presents with  . Insect Bite    check spot on back, has red spot on back, does not have anyone to check it at home, did it some, noticed one day this week    HPI  Patient presents today for lesion on the lower back.  She is concerned in the tick bite.  She scratched added and a scab or the body of the tick came off leaving a black head.  This was one day earlier this week she does not remember exactly what day.  She is to start radiation treatment in 2 days and wants to know what to do.  PMH: Smoking status noted ROS: Per HPI  Objective: BP 112/67   Pulse 86   Temp 98.2 F (36.8 C) (Oral)   Ht 5\' 8"  (1.727 m)   Wt 155 lb 12.8 oz (70.7 kg)   LMP 04/25/1998   BMI 23.69 kg/m  Gen: NAD, alert, cooperative with exam HEENT: NCAT, EOMI, PERRL Skin: Just to the right of midline at approximately the level of L4-5 region there is a 4 mm erythematous excoriated region with a 1 mm blackhead.  It is difficult to tell if this is a scab or the h ead of the tick therefore it was explored with an instrument and appears to be a blackhead that is deeply rooted and could not be easily removed. Ext: No edema, warm Neuro: Alert and oriented, No gross deficits  Assessment and plan:  1. Abrasion of skin with infection   2. Blackhead     Meds ordered this encounter  Medications  . doxycycline (VIBRAMYCIN) 100 MG capsule    Sig: Take 1 capsule (100 mg total) by mouth 2 (two) times daily. For 5 days. Take on empty stomach.    Dispense:  10 capsule    Refill:  0    Out of concern due to the epic to make of tick bites and a significant numbers of [facility] spotted fever cases seen in the area of the summer went ahead with a short prophylactic course of doxycycline.  She is due to start radiation in 2 days.  She should inform her radiation oncologist of the use of doxycycline since it tends to sensitize the skin they may want to delay her treatment a few days  Follow  up as needed.  Claretta Fraise, MD

## 2017-11-06 ENCOUNTER — Telehealth: Payer: Self-pay

## 2017-11-06 ENCOUNTER — Ambulatory Visit
Admission: RE | Admit: 2017-11-06 | Discharge: 2017-11-06 | Disposition: A | Discharge status: Home / Self Care | Payer: Medicare HMO | Source: Ambulatory Visit | Attending: Radiation Oncology | Admitting: Radiation Oncology

## 2017-11-06 DIAGNOSIS — Z510 Encounter for antineoplastic radiation therapy: Secondary | ICD-10-CM | POA: Diagnosis not present

## 2017-11-06 DIAGNOSIS — D0511 Intraductal carcinoma in situ of right breast: Secondary | ICD-10-CM | POA: Diagnosis not present

## 2017-11-06 NOTE — Telephone Encounter (Signed)
Left VM on pt's identified VM box. Conveyed antibiotic was acceptable to take while receiving radiation. This RN's direct number left on VM for any further questions/concerns. Loma Sousa, RN BSN

## 2017-11-06 NOTE — Telephone Encounter (Signed)
Pt calling back to verify that pt can take antibiotic and receive radiation treatment today. Confirmed with pt. Pt verbalized understanding. Loma Sousa, RN BSN

## 2017-11-07 ENCOUNTER — Ambulatory Visit
Admission: RE | Admit: 2017-11-07 | Discharge: 2017-11-07 | Disposition: A | Discharge status: Home / Self Care | Payer: Medicare HMO | Source: Ambulatory Visit | Attending: Radiation Oncology | Admitting: Radiation Oncology

## 2017-11-07 DIAGNOSIS — Z510 Encounter for antineoplastic radiation therapy: Secondary | ICD-10-CM | POA: Diagnosis not present

## 2017-11-07 DIAGNOSIS — Z170 Estrogen receptor positive status [ER+]: Principal | ICD-10-CM

## 2017-11-07 DIAGNOSIS — D0511 Intraductal carcinoma in situ of right breast: Secondary | ICD-10-CM | POA: Diagnosis not present

## 2017-11-07 DIAGNOSIS — C50411 Malignant neoplasm of upper-outer quadrant of right female breast: Secondary | ICD-10-CM

## 2017-11-07 MED ORDER — RADIAPLEXRX EX GEL
Freq: Once | CUTANEOUS | Status: AC
  Administered 2017-11-07: 16:00:00 via TOPICAL

## 2017-11-07 MED ORDER — ALRA NON-METALLIC DEODORANT (RAD-ONC)
1.0000 "application " | Freq: Once | TOPICAL | Status: AC
  Administered 2017-11-07: 1 via TOPICAL

## 2017-11-07 NOTE — Progress Notes (Signed)
Pt here for patient teaching.  Pt given Radiation and You booklet, skin care instructions, Alra deodorant and Radiaplex gel.  Reviewed areas of pertinence such as fatigue, hair loss, skin changes, breast tenderness and breast swelling . Pt able to give teach back of to pat skin and use unscented/gentle soap,apply Radiaplex bid and avoid applying anything to skin within 4 hours of treatment. Pt demonstrated understanding and verbalizes understanding of information given and will contact nursing with any questions or concerns.     Http://rtanswers.org/treatmentinformation/whattoexpect/index   Loma Sousa, RN BSN

## 2017-11-08 ENCOUNTER — Ambulatory Visit
Admission: RE | Admit: 2017-11-08 | Discharge: 2017-11-08 | Disposition: A | Discharge status: Home / Self Care | Payer: Medicare HMO | Source: Ambulatory Visit | Attending: Radiation Oncology | Admitting: Radiation Oncology

## 2017-11-08 DIAGNOSIS — D0511 Intraductal carcinoma in situ of right breast: Secondary | ICD-10-CM | POA: Diagnosis not present

## 2017-11-08 DIAGNOSIS — Z510 Encounter for antineoplastic radiation therapy: Secondary | ICD-10-CM | POA: Diagnosis not present

## 2017-11-09 ENCOUNTER — Ambulatory Visit
Admission: RE | Admit: 2017-11-09 | Discharge: 2017-11-09 | Disposition: A | Discharge status: Home / Self Care | Payer: Medicare HMO | Source: Ambulatory Visit | Attending: Radiation Oncology | Admitting: Radiation Oncology

## 2017-11-09 DIAGNOSIS — D0511 Intraductal carcinoma in situ of right breast: Secondary | ICD-10-CM | POA: Diagnosis not present

## 2017-11-09 DIAGNOSIS — Z510 Encounter for antineoplastic radiation therapy: Secondary | ICD-10-CM | POA: Diagnosis not present

## 2017-11-10 ENCOUNTER — Ambulatory Visit
Admission: RE | Admit: 2017-11-10 | Discharge: 2017-11-10 | Disposition: A | Discharge status: Home / Self Care | Payer: Medicare HMO | Source: Ambulatory Visit | Attending: Radiation Oncology | Admitting: Radiation Oncology

## 2017-11-10 DIAGNOSIS — Z510 Encounter for antineoplastic radiation therapy: Secondary | ICD-10-CM | POA: Diagnosis not present

## 2017-11-10 DIAGNOSIS — D0511 Intraductal carcinoma in situ of right breast: Secondary | ICD-10-CM | POA: Diagnosis not present

## 2017-11-13 ENCOUNTER — Ambulatory Visit
Admission: RE | Admit: 2017-11-13 | Discharge: 2017-11-13 | Disposition: A | Discharge status: Home / Self Care | Payer: Medicare HMO | Source: Ambulatory Visit | Attending: Radiation Oncology | Admitting: Radiation Oncology

## 2017-11-13 DIAGNOSIS — D0511 Intraductal carcinoma in situ of right breast: Secondary | ICD-10-CM | POA: Diagnosis not present

## 2017-11-13 DIAGNOSIS — Z510 Encounter for antineoplastic radiation therapy: Secondary | ICD-10-CM | POA: Diagnosis not present

## 2017-11-14 ENCOUNTER — Ambulatory Visit
Admission: RE | Admit: 2017-11-14 | Discharge: 2017-11-14 | Disposition: A | Discharge status: Home / Self Care | Payer: Medicare HMO | Source: Ambulatory Visit | Attending: Radiation Oncology | Admitting: Radiation Oncology

## 2017-11-14 DIAGNOSIS — Z510 Encounter for antineoplastic radiation therapy: Secondary | ICD-10-CM | POA: Diagnosis not present

## 2017-11-14 DIAGNOSIS — D0511 Intraductal carcinoma in situ of right breast: Secondary | ICD-10-CM | POA: Diagnosis not present

## 2017-11-15 ENCOUNTER — Ambulatory Visit
Admission: RE | Admit: 2017-11-15 | Discharge: 2017-11-15 | Disposition: A | Discharge status: Home / Self Care | Payer: Medicare HMO | Source: Ambulatory Visit | Attending: Radiation Oncology | Admitting: Radiation Oncology

## 2017-11-15 ENCOUNTER — Ambulatory Visit: Payer: Medicare HMO | Admitting: Oncology

## 2017-11-15 DIAGNOSIS — D0511 Intraductal carcinoma in situ of right breast: Secondary | ICD-10-CM | POA: Diagnosis not present

## 2017-11-15 DIAGNOSIS — Z510 Encounter for antineoplastic radiation therapy: Secondary | ICD-10-CM | POA: Diagnosis not present

## 2017-11-16 ENCOUNTER — Ambulatory Visit
Admission: RE | Admit: 2017-11-16 | Discharge: 2017-11-16 | Disposition: A | Discharge status: Home / Self Care | Payer: Medicare HMO | Source: Ambulatory Visit | Attending: Radiation Oncology | Admitting: Radiation Oncology

## 2017-11-16 DIAGNOSIS — Z510 Encounter for antineoplastic radiation therapy: Secondary | ICD-10-CM | POA: Diagnosis not present

## 2017-11-16 DIAGNOSIS — D0511 Intraductal carcinoma in situ of right breast: Secondary | ICD-10-CM | POA: Diagnosis not present

## 2017-11-17 ENCOUNTER — Ambulatory Visit
Admission: RE | Admit: 2017-11-17 | Discharge: 2017-11-17 | Disposition: A | Discharge status: Home / Self Care | Payer: Medicare HMO | Source: Ambulatory Visit | Attending: Radiation Oncology | Admitting: Radiation Oncology

## 2017-11-17 DIAGNOSIS — D0511 Intraductal carcinoma in situ of right breast: Secondary | ICD-10-CM | POA: Diagnosis not present

## 2017-11-17 DIAGNOSIS — Z510 Encounter for antineoplastic radiation therapy: Secondary | ICD-10-CM | POA: Diagnosis not present

## 2017-11-20 ENCOUNTER — Ambulatory Visit
Admission: RE | Admit: 2017-11-20 | Discharge: 2017-11-20 | Disposition: A | Discharge status: Home / Self Care | Payer: Medicare HMO | Source: Ambulatory Visit | Attending: Radiation Oncology | Admitting: Radiation Oncology

## 2017-11-20 DIAGNOSIS — D0511 Intraductal carcinoma in situ of right breast: Secondary | ICD-10-CM | POA: Diagnosis not present

## 2017-11-20 DIAGNOSIS — Z510 Encounter for antineoplastic radiation therapy: Secondary | ICD-10-CM | POA: Diagnosis not present

## 2017-11-21 ENCOUNTER — Ambulatory Visit: Payer: Medicare HMO

## 2017-11-21 ENCOUNTER — Other Ambulatory Visit: Payer: Self-pay | Admitting: Radiation Oncology

## 2017-11-21 ENCOUNTER — Ambulatory Visit
Admission: RE | Admit: 2017-11-21 | Discharge: 2017-11-21 | Disposition: A | Discharge status: Home / Self Care | Payer: Medicare HMO | Source: Ambulatory Visit | Attending: Radiation Oncology | Admitting: Radiation Oncology

## 2017-11-21 DIAGNOSIS — Z510 Encounter for antineoplastic radiation therapy: Secondary | ICD-10-CM | POA: Diagnosis not present

## 2017-11-21 DIAGNOSIS — R300 Dysuria: Secondary | ICD-10-CM

## 2017-11-21 DIAGNOSIS — D0511 Intraductal carcinoma in situ of right breast: Secondary | ICD-10-CM | POA: Diagnosis not present

## 2017-11-22 ENCOUNTER — Ambulatory Visit
Admission: RE | Admit: 2017-11-22 | Discharge: 2017-11-22 | Disposition: A | Discharge status: Home / Self Care | Payer: Medicare HMO | Source: Ambulatory Visit | Attending: Radiation Oncology | Admitting: Radiation Oncology

## 2017-11-22 DIAGNOSIS — Z510 Encounter for antineoplastic radiation therapy: Secondary | ICD-10-CM | POA: Diagnosis not present

## 2017-11-22 DIAGNOSIS — D0511 Intraductal carcinoma in situ of right breast: Secondary | ICD-10-CM | POA: Diagnosis not present

## 2017-11-22 DIAGNOSIS — R300 Dysuria: Secondary | ICD-10-CM

## 2017-11-22 LAB — URINALYSIS, COMPLETE (UACMP) WITH MICROSCOPIC
Bilirubin Urine: NEGATIVE
Glucose, UA: NEGATIVE mg/dL
Ketones, ur: NEGATIVE mg/dL
NITRITE: NEGATIVE
Protein, ur: NEGATIVE mg/dL
SPECIFIC GRAVITY, URINE: 1.004 — AB (ref 1.005–1.030)
pH: 6 (ref 5.0–8.0)

## 2017-11-23 ENCOUNTER — Ambulatory Visit
Admission: RE | Admit: 2017-11-23 | Discharge: 2017-11-23 | Disposition: A | Discharge status: Home / Self Care | Payer: Medicare HMO | Source: Ambulatory Visit | Attending: Radiation Oncology | Admitting: Radiation Oncology

## 2017-11-23 DIAGNOSIS — C50411 Malignant neoplasm of upper-outer quadrant of right female breast: Secondary | ICD-10-CM | POA: Diagnosis not present

## 2017-11-23 DIAGNOSIS — Z510 Encounter for antineoplastic radiation therapy: Secondary | ICD-10-CM | POA: Diagnosis not present

## 2017-11-23 DIAGNOSIS — D0511 Intraductal carcinoma in situ of right breast: Secondary | ICD-10-CM | POA: Diagnosis not present

## 2017-11-24 ENCOUNTER — Ambulatory Visit
Admission: RE | Admit: 2017-11-24 | Discharge: 2017-11-24 | Disposition: A | Discharge status: Home / Self Care | Payer: Medicare HMO | Source: Ambulatory Visit | Attending: Radiation Oncology | Admitting: Radiation Oncology

## 2017-11-24 DIAGNOSIS — D0511 Intraductal carcinoma in situ of right breast: Secondary | ICD-10-CM | POA: Diagnosis not present

## 2017-11-24 DIAGNOSIS — Z510 Encounter for antineoplastic radiation therapy: Secondary | ICD-10-CM | POA: Diagnosis not present

## 2017-11-25 LAB — URINE CULTURE

## 2017-11-27 ENCOUNTER — Ambulatory Visit
Admission: RE | Admit: 2017-11-27 | Discharge: 2017-11-27 | Disposition: A | Discharge status: Home / Self Care | Payer: Medicare HMO | Source: Ambulatory Visit | Attending: Radiation Oncology | Admitting: Radiation Oncology

## 2017-11-27 ENCOUNTER — Encounter: Payer: Self-pay | Admitting: Radiation Oncology

## 2017-11-27 ENCOUNTER — Encounter: Payer: Self-pay | Admitting: *Deleted

## 2017-11-27 ENCOUNTER — Other Ambulatory Visit: Payer: Self-pay | Admitting: Radiation Oncology

## 2017-11-27 DIAGNOSIS — Z510 Encounter for antineoplastic radiation therapy: Secondary | ICD-10-CM | POA: Diagnosis not present

## 2017-11-27 DIAGNOSIS — D0511 Intraductal carcinoma in situ of right breast: Secondary | ICD-10-CM | POA: Diagnosis not present

## 2017-11-27 MED ORDER — CIPROFLOXACIN HCL 250 MG PO TABS: 250.0000 mg | ORAL_TABLET | Freq: Two times a day (BID) | ORAL | 0 refills | Status: DC

## 2017-11-27 NOTE — Progress Notes (Signed)
Patient called.  Left detailed VM on identified VM re: Cipro script. This RN's direct number left for any further questions/concerns. Loma Sousa, RN BSN

## 2017-11-29 NOTE — Progress Notes (Signed)
  Radiation Oncology         [phone] ________________________________  Name: Tiffany Dillon MRN: [id number]  Date: 11/27/2017  DOB: 24-Feb-1951  End of Treatment Note  Diagnosis:   67 y.o. female with ductal carcinoma in situ, intermediate grade with focal necrosis presenting in the right breast, ER positive, PR positive     Indication for treatment:  Curative       Radiation treatment dates:   11/06/2017 to 11/27/2017  Site/dose:   The Right breast was treated to 42.72 Gy in 16 fractions of 2.67 Gy.  Beams/energy:   3d // 6X  Narrative: The patient tolerated radiation treatment relatively well.   Patient developed minimal hyperpigmentation changes within the right breast. Reports urgency, dysuria, and history of frequent bladder infections.  Plan: The patient has completed radiation treatment. She will present to the lab for urinalysis culture and sensitivity. The patient will return to radiation oncology clinic for routine followup in one month. I advised them to call or return sooner if they have any questions or concerns related to their recovery or treatment.  -----------------------------------  Blair Promise, PhD, MD  This document serves as a record of services personally performed by Gery Pray, MD. It was created on his behalf by Arlyce Harman, a trained medical scribe. The creation of this record is based on the scribe's personal observations and the provider's statements to them. This document has been checked and approved by the attending provider.

## 2018-01-01 ENCOUNTER — Other Ambulatory Visit: Payer: Self-pay

## 2018-01-01 ENCOUNTER — Ambulatory Visit
Admission: RE | Admit: 2018-01-01 | Discharge: 2018-01-01 | Disposition: A | Discharge status: Home / Self Care | Payer: Medicare HMO | Source: Ambulatory Visit | Attending: Radiation Oncology | Admitting: Radiation Oncology

## 2018-01-01 VITALS — BP 123/60 | HR 80 | Temp 98.3°F | Wt 155.8 lb

## 2018-01-01 DIAGNOSIS — Z882 Allergy status to sulfonamides status: Secondary | ICD-10-CM | POA: Diagnosis not present

## 2018-01-01 DIAGNOSIS — Z885 Allergy status to narcotic agent status: Secondary | ICD-10-CM | POA: Insufficient documentation

## 2018-01-01 DIAGNOSIS — Z79890 Hormone replacement therapy (postmenopausal): Secondary | ICD-10-CM | POA: Diagnosis not present

## 2018-01-01 DIAGNOSIS — Z170 Estrogen receptor positive status [ER+]: Secondary | ICD-10-CM | POA: Insufficient documentation

## 2018-01-01 DIAGNOSIS — Z7982 Long term (current) use of aspirin: Secondary | ICD-10-CM | POA: Diagnosis not present

## 2018-01-01 DIAGNOSIS — C50411 Malignant neoplasm of upper-outer quadrant of right female breast: Secondary | ICD-10-CM | POA: Insufficient documentation

## 2018-01-01 NOTE — Progress Notes (Signed)
Radiation Oncology         [phone] ________________________________  Name: Tiffany Dillon MRN: [id number]  Date: 01/01/2018  DOB: 02-23-1951  Follow-Up Visit Note  CC: Eustaquio Maize, MD  Jovita Kussmaul, MD    ICD-10-CM   1. Malignant neoplasm of upper-outer quadrant of right breast in female, estrogen receptor positive ([place]) C50.411    Z17.0     Diagnosis:   67 y.o.female with ductal carcinoma in situ, intermediate grade with focal necrosis presenting in the right breast, ER positive, PR positive     Interval Since Last Radiation:  1 month and 4 days   Radiation treatment dates:   11/06/2017 to 11/27/2017  Site/dose:   The Right breast was treated to 42.72 Gy in 16 fractions of 2.67 Gy  Narrative:  The patient returns today for routine follow-up following completion of her recent radiation therapy on 11/27/2017. She is doing well overall. She notes a recent decline in her energy and notes that she is about 70% back to normal. Patient has been using the creams as prescribed. She has a follow up appointment with Dr. Jana Hakim on 03/07/2018.   On review of systems, she reports mild fatigue. she denies discomfort/itching in breast, cough, SOB, and any other symptoms. Pertinent positives are listed and detailed within the above HPI.                 ALLERGIES:  is allergic to bactrim [sulfamethoxazole-trimethoprim] and codeine.  Meds: Current Outpatient Medications  Medication Sig Dispense Refill  . aspirin 81 MG tablet Take 81 mg by mouth See admin instructions. Take 81 mg 5 times a week    . Cholecalciferol (VITAMIN D3) LIQD Take 2,000-3,000 Units by mouth daily.    . Iron-Vitamin C (IRON 100/C PO) Take 1 capsule by mouth daily.    . Multiple Vitamin (MULTIVITAMIN) tablet Take 1 tablet by mouth daily.    . Omega-3 Fatty Acids (FISH OIL) 500 MG CAPS Take 500 mg by mouth daily.    Marland Kitchen OVER THE COUNTER MEDICATION Take 1 capsule by mouth every other day. Blood builder otc  supplement    . Probiotic CAPS Take 1 capsule by mouth daily.    . vitamin C (ASCORBIC ACID) 500 MG tablet Take 500 mg by mouth daily.    . ciprofloxacin (CIPRO) 250 MG tablet Take 1 tablet (250 mg total) by mouth 2 (two) times daily. (Patient not taking: Reported on 01/01/2018) 10 tablet 0  . doxycycline (VIBRAMYCIN) 100 MG capsule Take 1 capsule (100 mg total) by mouth 2 (two) times daily. For 5 days. Take on empty stomach. (Patient not taking: Reported on 01/01/2018) 10 capsule 0   No current facility-administered medications for this encounter.     Physical Findings: The patient is in no acute distress. Patient is alert and oriented.  weight is 155 lb 12.8 oz (70.7 kg). Her oral temperature is 98.3 F (36.8 C). Her blood pressure is 123/60 and her pulse is 80. Her oxygen saturation is 100%.   Lungs are clear to auscultation bilaterally. Heart has regular rate and rhythm. No palpable cervical, supraclavicular, or axillary adenopathy. Abdomen soft, non-tender, normal bowel sounds. Left breast no palpable mass, nipple discharge, or bleeding. Right breast has healed well. Mild hyperpigmentation changes. Mild swelling in the breast area. No palpable mass, nipple discharge, or bleeding.    Lab Findings: Lab Results  Component Value Date   WBC 4.6 08/25/2017   HGB 14.2 08/25/2017  HCT 43.1 08/25/2017   MCV 97.7 08/25/2017   PLT 270 08/25/2017    Radiographic Findings: No results found.  Impression: No evidence of recurrence on clinical exam. Minimal fatigue at this time. No other side effects noted.   Plan:  PRN Follow up in Radiation Oncology. Patient will continue close follow up in Medical Oncology. Suggested she consider moving up her medical onc follow-up appointment sooner so that she can proceed with hormonal therapy.  ____________________________________   Blair Promise, PhD, MD    This document serves as a record of services personally performed by Gery Pray, MD. It  was created on his behalf by [facility], a trained medical scribe. The creation of this record is based on the scribe's personal observations and the provider's statements to them. This document has been checked and approved by the attending provider.

## 2018-01-01 NOTE — Progress Notes (Signed)
Patient denies any pain. States that she has moderate fatigue. States that her skin is hyperpigmented some in her radiation site. Vitals:   01/01/18 1531  BP: 123/60  Pulse: 80  Temp: 98.3 F (36.8 C)  TempSrc: Oral  SpO2: 100%  Weight: 155 lb 12.8 oz (70.7 kg)   Wt Readings from Last 3 Encounters:  01/01/18 155 lb 12.8 oz (70.7 kg)  11/04/17 155 lb 12.8 oz (70.7 kg)  10/20/17 156 lb 4.8 oz (70.9 kg)

## 2018-01-10 ENCOUNTER — Ambulatory Visit (INDEPENDENT_AMBULATORY_CARE_PROVIDER_SITE_OTHER): Payer: Medicare HMO | Admitting: Pediatrics

## 2018-01-10 ENCOUNTER — Encounter: Payer: Self-pay | Admitting: Pediatrics

## 2018-01-10 VITALS — BP 113/70 | HR 72 | Temp 97.1°F | Ht 68.0 in | Wt 155.0 lb

## 2018-01-10 DIAGNOSIS — Z170 Estrogen receptor positive status [ER+]: Secondary | ICD-10-CM | POA: Diagnosis not present

## 2018-01-10 DIAGNOSIS — C50411 Malignant neoplasm of upper-outer quadrant of right female breast: Secondary | ICD-10-CM | POA: Diagnosis not present

## 2018-01-10 DIAGNOSIS — F4321 Adjustment disorder with depressed mood: Secondary | ICD-10-CM

## 2018-01-10 DIAGNOSIS — R69 Illness, unspecified: Secondary | ICD-10-CM | POA: Diagnosis not present

## 2018-01-10 NOTE — Progress Notes (Signed)
  Subjective:   Patient ID: Tiffany Dillon, female    DOB: 1950-05-05, 67 y.o.   MRN: [id number] CC: Medical Management of Chronic Issues  HPI: Tiffany Dillon is a 67 y.o. female   Breast cancer: finished radiation therapy, upcoming appt, likely to start anti-estrogen therapy.   Feeling well overall. No urinary symptoms.Has upcoming appt with gynecology for yearly exam. No urinary/vaginal symptoms. Appetite has been ok, normal stooling. Grieving husband after recent loss.   Relevant past medical, surgical, family and social history reviewed. Allergies and medications reviewed and updated. Social History   Tobacco Use  Smoking Status Never Smoker  Smokeless Tobacco Never Used   ROS: Per HPI   Objective:    BP 113/70   Pulse 72   Temp (!) 97.1 F (36.2 C) (Oral)   Ht 5\' 8"  (1.727 m)   Wt 155 lb (70.3 kg)   LMP 04/25/1998   BMI 23.57 kg/m   Wt Readings from Last 3 Encounters:  01/10/18 155 lb (70.3 kg)  01/01/18 155 lb 12.8 oz (70.7 kg)  11/04/17 155 lb 12.8 oz (70.7 kg)    Gen: NAD, alert, cooperative with exam, NCAT EYES: EOMI, no conjunctival injection, or no icterus CV: NRRR, normal S1/S2, no murmur, distal pulses 2+ b/l Resp: CTABL, no wheezes, normal WOB Ext: No edema, warm Neuro: Alert and oriented, strength equal b/l UE and LE, coordination grossly normal MSK: normal muscle bulk  Assessment & Plan:  [place] was seen today for medical management of chronic issues.  Diagnoses and all orders for this visit:  Grief Has good support in area, area resources discussed. Will let me know if any worsening in symptoms.  Malignant neoplasm of upper-outer quadrant of right breast in female, estrogen receptor positive [facility]) Following with oncology.  Declines tetanus shot today  Follow up plan: Return in about 1 year (around 01/11/2019). Sooner if needed. Assunta Found, MD [place]

## 2018-02-16 ENCOUNTER — Ambulatory Visit (INDEPENDENT_AMBULATORY_CARE_PROVIDER_SITE_OTHER): Payer: Medicare HMO | Admitting: Obstetrics & Gynecology

## 2018-02-16 ENCOUNTER — Encounter: Payer: Self-pay | Admitting: Obstetrics & Gynecology

## 2018-02-16 ENCOUNTER — Encounter

## 2018-02-16 VITALS — BP 120/84 | HR 76 | Resp 16 | Ht 67.75 in | Wt 155.2 lb

## 2018-02-16 DIAGNOSIS — E889 Metabolic disorder, unspecified: Secondary | ICD-10-CM | POA: Diagnosis not present

## 2018-02-16 DIAGNOSIS — Z0000 Encounter for general adult medical examination without abnormal findings: Secondary | ICD-10-CM

## 2018-02-16 DIAGNOSIS — E118 Type 2 diabetes mellitus with unspecified complications: Secondary | ICD-10-CM | POA: Diagnosis not present

## 2018-02-16 DIAGNOSIS — E034 Atrophy of thyroid (acquired): Secondary | ICD-10-CM | POA: Diagnosis not present

## 2018-02-16 DIAGNOSIS — E789 Disorder of lipoprotein metabolism, unspecified: Secondary | ICD-10-CM | POA: Diagnosis not present

## 2018-02-16 DIAGNOSIS — Z01419 Encounter for gynecological examination (general) (routine) without abnormal findings: Secondary | ICD-10-CM | POA: Diagnosis not present
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
## 2018-02-17 LAB — TSH: TSH: 3.82 u[IU]/mL (ref 0.450–4.500)

## 2018-02-17 LAB — COMPREHENSIVE METABOLIC PANEL
A/G RATIO: 2.1 (ref 1.2–2.2)
ALK PHOS: 76 IU/L (ref 39–117)
ALT: 25 IU/L (ref 0–32)
AST: 22 IU/L (ref 0–40)
Albumin: 4.5 g/dL (ref 3.6–4.8)
BILIRUBIN TOTAL: 0.6 mg/dL (ref 0.0–1.2)
BUN/Creatinine Ratio: 25 (ref 12–28)
BUN: 17 mg/dL (ref 8–27)
CO2: 25 mmol/L (ref 20–29)
Calcium: 9.7 mg/dL (ref 8.7–10.3)
Chloride: 103 mmol/L (ref 96–106)
Creatinine, Ser: 0.68 mg/dL (ref 0.57–1.00)
GFR calc Af Amer: 104 mL/min/{1.73_m2} (ref >59)
GFR calc non Af Amer: 90 mL/min/{1.73_m2} (ref >59)
GLUCOSE: 76 mg/dL (ref 65–99)
Globulin, Total: 2.1 g/dL (ref 1.5–4.5)
POTASSIUM: 4.3 mmol/L (ref 3.5–5.2)
Sodium: 142 mmol/L (ref 134–144)
Total Protein: 6.6 g/dL (ref 6.0–8.5)

## 2018-02-17 LAB — LIPID PANEL
Chol/HDL Ratio: 2.7 ratio (ref 0.0–4.4)
Cholesterol, Total: 207 mg/dL — ABNORMAL HIGH (ref 100–199)
HDL: 78 mg/dL (ref >39)
LDL CALC: 119 mg/dL — AB (ref 0–99)
Triglycerides: 52 mg/dL (ref 0–149)
VLDL Cholesterol Cal: 10 mg/dL (ref 5–40)

## 2018-02-17 LAB — HEMOGLOBIN A1C
ESTIMATED AVERAGE GLUCOSE: 100 mg/dL
Hgb A1c MFr Bld: 5.1 % (ref 4.8–5.6)

## 2018-02-18 DIAGNOSIS — R69 Illness, unspecified: Secondary | ICD-10-CM | POA: Diagnosis not present
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
## 2018-03-07 ENCOUNTER — Encounter: Payer: Self-pay | Admitting: Oncology

## 2018-03-07 ENCOUNTER — Inpatient Hospital Stay: Payer: Medicare HMO

## 2018-03-07 ENCOUNTER — Telehealth: Payer: Self-pay | Admitting: Oncology

## 2018-03-07 ENCOUNTER — Inpatient Hospital Stay: Payer: Medicare HMO | Attending: Oncology | Admitting: Oncology

## 2018-03-07 VITALS — BP 136/71 | HR 74 | Temp 97.5°F | Resp 18 | Ht 67.75 in | Wt 155.4 lb

## 2018-03-07 DIAGNOSIS — Z8042 Family history of malignant neoplasm of prostate: Secondary | ICD-10-CM | POA: Insufficient documentation

## 2018-03-07 DIAGNOSIS — D0511 Intraductal carcinoma in situ of right breast: Secondary | ICD-10-CM | POA: Insufficient documentation

## 2018-03-07 DIAGNOSIS — Z801 Family history of malignant neoplasm of trachea, bronchus and lung: Secondary | ICD-10-CM | POA: Diagnosis not present

## 2018-03-07 DIAGNOSIS — Z803 Family history of malignant neoplasm of breast: Secondary | ICD-10-CM | POA: Insufficient documentation

## 2018-03-07 DIAGNOSIS — E7800 Pure hypercholesterolemia, unspecified: Secondary | ICD-10-CM | POA: Insufficient documentation

## 2018-03-07 DIAGNOSIS — R5383 Other fatigue: Secondary | ICD-10-CM | POA: Diagnosis not present

## 2018-03-07 DIAGNOSIS — M1990 Unspecified osteoarthritis, unspecified site: Secondary | ICD-10-CM | POA: Insufficient documentation

## 2018-03-07 DIAGNOSIS — Z7982 Long term (current) use of aspirin: Secondary | ICD-10-CM | POA: Diagnosis not present

## 2018-03-07 DIAGNOSIS — Z170 Estrogen receptor positive status [ER+]: Secondary | ICD-10-CM | POA: Insufficient documentation

## 2018-03-07 DIAGNOSIS — C50411 Malignant neoplasm of upper-outer quadrant of right female breast: Secondary | ICD-10-CM

## 2018-03-07 DIAGNOSIS — Z800 Family history of malignant neoplasm of digestive organs: Secondary | ICD-10-CM | POA: Insufficient documentation

## 2018-03-07 DIAGNOSIS — Z79899 Other long term (current) drug therapy: Secondary | ICD-10-CM | POA: Insufficient documentation

## 2018-03-07 NOTE — Telephone Encounter (Signed)
Printed calendar and avs. °

## 2018-03-13 ENCOUNTER — Other Ambulatory Visit: Payer: Self-pay | Admitting: Oncology

## 2018-03-26 DIAGNOSIS — L821 Other seborrheic keratosis: Secondary | ICD-10-CM | POA: Diagnosis not present

## 2018-03-26 DIAGNOSIS — D225 Melanocytic nevi of trunk: Secondary | ICD-10-CM | POA: Diagnosis not present

## 2018-03-26 DIAGNOSIS — D485 Neoplasm of uncertain behavior of skin: Secondary | ICD-10-CM | POA: Diagnosis not present

## 2018-04-19 ENCOUNTER — Encounter: Payer: Self-pay | Admitting: Family Medicine

## 2018-04-19 ENCOUNTER — Ambulatory Visit (INDEPENDENT_AMBULATORY_CARE_PROVIDER_SITE_OTHER): Payer: Medicare HMO | Admitting: Family Medicine

## 2018-04-19 VITALS — BP 136/76 | HR 80 | Temp 97.0°F | Ht 67.75 in | Wt 159.6 lb

## 2018-04-19 DIAGNOSIS — R300 Dysuria: Secondary | ICD-10-CM | POA: Diagnosis not present

## 2018-04-19 DIAGNOSIS — N3090 Cystitis, unspecified without hematuria: Secondary | ICD-10-CM

## 2018-04-19 LAB — URINALYSIS, COMPLETE
Bilirubin, UA: NEGATIVE
Glucose, UA: NEGATIVE
Ketones, UA: NEGATIVE
NITRITE UA: NEGATIVE
PH UA: 6 (ref 5.0–7.5)
Protein, UA: NEGATIVE
Specific Gravity, UA: <1.005 — ABNORMAL LOW (ref 1.005–1.030)
UUROB: 0.2 mg/dL (ref 0.2–1.0)

## 2018-04-19 LAB — MICROSCOPIC EXAMINATION: Renal Epithel, UA: NONE SEEN /hpf

## 2018-04-19 MED ORDER — AMOXICILLIN 500 MG PO CAPS: 500.0000 mg | ORAL_CAPSULE | Freq: Three times a day (TID) | ORAL | 0 refills | Status: DC

## 2018-04-19 NOTE — Progress Notes (Signed)
Chief Complaint  Patient presents with  . Dysuria    x 1 week  . Urinary Frequency    HPI  Patient presents today for burning with urination and frequency for several days. Denies fever . No flank pain. No nausea, vomiting.Onset 5 days ago.   PMH: Smoking status noted ROS: Per HPI  Objective: BP 136/76   Pulse 80   Temp (!) 97 F (36.1 C) (Oral)   Ht 5' 7.75" (1.721 m)   Wt 159 lb 9.6 oz (72.4 kg)   LMP 04/25/1998   BMI 24.45 kg/m  Gen: NAD, alert, cooperative with exam HEENT: NCAT, EOMI, PERRL CV: RRR, good S1/S2, no murmur Resp: CTABL, no wheezes, non-labored Abd: SNTND, BS present, no guarding or organomegaly. No flank tenderness Ext: No edema, warm Neuro: Alert and oriented, No gross deficits  Assessment and plan:  1. Dysuria   2. Cystitis     Meds ordered this encounter  Medications  . amoxicillin (AMOXIL) 500 MG capsule    Sig: Take 1 capsule (500 mg total) by mouth 3 (three) times daily.    Dispense:  21 capsule    Refill:  0    Orders Placed This Encounter  Procedures  . Urinalysis, Complete    Follow up as needed.  Claretta Fraise, MD

## 2018-05-01 ENCOUNTER — Encounter: Payer: Medicare HMO | Admitting: Adult Health

## 2018-05-23 DIAGNOSIS — H25811 Combined forms of age-related cataract, right eye: Secondary | ICD-10-CM | POA: Diagnosis not present

## 2018-05-23 DIAGNOSIS — H11241 Scarring of conjunctiva, right eye: Secondary | ICD-10-CM | POA: Diagnosis not present

## 2018-05-25 DIAGNOSIS — H0100A Unspecified blepharitis right eye, upper and lower eyelids: Secondary | ICD-10-CM | POA: Diagnosis not present

## 2018-05-25 DIAGNOSIS — Z0100 Encounter for examination of eyes and vision without abnormal findings: Secondary | ICD-10-CM | POA: Diagnosis not present

## 2018-05-25 DIAGNOSIS — H25013 Cortical age-related cataract, bilateral: Secondary | ICD-10-CM | POA: Diagnosis not present

## 2018-05-25 DIAGNOSIS — H524 Presbyopia: Secondary | ICD-10-CM | POA: Diagnosis not present

## 2018-05-25 DIAGNOSIS — H2513 Age-related nuclear cataract, bilateral: Secondary | ICD-10-CM | POA: Diagnosis not present

## 2018-06-08 ENCOUNTER — Other Ambulatory Visit: Payer: Self-pay | Admitting: Genetic Counselor

## 2018-06-08 DIAGNOSIS — C50411 Malignant neoplasm of upper-outer quadrant of right female breast: Secondary | ICD-10-CM

## 2018-06-08 DIAGNOSIS — Z803 Family history of malignant neoplasm of breast: Secondary | ICD-10-CM

## 2018-06-08 DIAGNOSIS — Z800 Family history of malignant neoplasm of digestive organs: Secondary | ICD-10-CM

## 2018-06-08 DIAGNOSIS — Z8042 Family history of malignant neoplasm of prostate: Secondary | ICD-10-CM

## 2018-06-08 DIAGNOSIS — Z170 Estrogen receptor positive status [ER+]: Principal | ICD-10-CM

## 2018-06-11 ENCOUNTER — Telehealth: Payer: Self-pay | Admitting: Oncology

## 2018-06-11 ENCOUNTER — Inpatient Hospital Stay: Payer: Medicare HMO

## 2018-06-11 NOTE — Telephone Encounter (Signed)
Called patient per 2/17 sch message - left message for patient to call back to r/s

## 2018-06-14 ENCOUNTER — Telehealth: Payer: Self-pay | Admitting: Genetics

## 2018-06-14 NOTE — Telephone Encounter (Signed)
I was informed that the patient had questions about genetic testing/counseling.  I left a message introducing myself and left my phone number to call back with questions.

## 2018-06-18 DIAGNOSIS — D2262 Melanocytic nevi of left upper limb, including shoulder: Secondary | ICD-10-CM | POA: Diagnosis not present

## 2018-06-18 DIAGNOSIS — D225 Melanocytic nevi of trunk: Secondary | ICD-10-CM | POA: Diagnosis not present

## 2018-06-18 DIAGNOSIS — L738 Other specified follicular disorders: Secondary | ICD-10-CM | POA: Diagnosis not present

## 2018-06-18 DIAGNOSIS — L821 Other seborrheic keratosis: Secondary | ICD-10-CM | POA: Diagnosis not present

## 2018-06-18 DIAGNOSIS — Z419 Encounter for procedure for purposes other than remedying health state, unspecified: Secondary | ICD-10-CM | POA: Diagnosis not present

## 2018-06-20 ENCOUNTER — Encounter: Payer: Self-pay | Admitting: Pediatrics

## 2018-08-01 ENCOUNTER — Telehealth: Payer: Self-pay | Admitting: Oncology

## 2018-08-01 NOTE — Telephone Encounter (Signed)
Patient returned call and rescheduled f/u to 6/9.

## 2018-08-01 NOTE — Telephone Encounter (Signed)
Returned patient call re rescheduling 4/20 f/u. Not able to reach patient. Left message asking patient to call office to let us know when she can reschedule for new appointment. F/u 4/20 cancelled.

## 2018-08-13 ENCOUNTER — Ambulatory Visit: Payer: Medicare HMO | Admitting: Oncology

## 2018-09-20 ENCOUNTER — Ambulatory Visit
Admission: RE | Admit: 2018-09-20 | Discharge: 2018-09-20 | Disposition: A | Discharge status: Home / Self Care | Payer: Medicare HMO | Source: Ambulatory Visit | Attending: Oncology | Admitting: Oncology

## 2018-09-20 ENCOUNTER — Other Ambulatory Visit: Payer: Self-pay

## 2018-09-20 DIAGNOSIS — Z853 Personal history of malignant neoplasm of breast: Secondary | ICD-10-CM | POA: Diagnosis not present

## 2018-09-20 DIAGNOSIS — Z170 Estrogen receptor positive status [ER+]: Secondary | ICD-10-CM

## 2018-09-20 DIAGNOSIS — R928 Other abnormal and inconclusive findings on diagnostic imaging of breast: Secondary | ICD-10-CM | POA: Diagnosis not present

## 2018-09-20 DIAGNOSIS — C50411 Malignant neoplasm of upper-outer quadrant of right female breast: Secondary | ICD-10-CM

## 2018-09-20 HISTORY — DX: Malignant (primary) neoplasm, unspecified: C80.1
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
## 2018-10-02 ENCOUNTER — Other Ambulatory Visit: Payer: Self-pay

## 2018-10-02 ENCOUNTER — Inpatient Hospital Stay: Payer: Medicare HMO | Attending: Oncology | Admitting: Oncology

## 2018-10-02 VITALS — BP 142/53 | HR 74 | Temp 97.7°F | Resp 18 | Ht 67.75 in | Wt 155.9 lb

## 2018-10-02 DIAGNOSIS — M1990 Unspecified osteoarthritis, unspecified site: Secondary | ICD-10-CM | POA: Diagnosis not present

## 2018-10-02 DIAGNOSIS — D0511 Intraductal carcinoma in situ of right breast: Secondary | ICD-10-CM | POA: Diagnosis not present

## 2018-10-02 DIAGNOSIS — Z170 Estrogen receptor positive status [ER+]: Secondary | ICD-10-CM | POA: Insufficient documentation

## 2018-10-02 DIAGNOSIS — Z923 Personal history of irradiation: Secondary | ICD-10-CM | POA: Diagnosis not present

## 2018-10-02 DIAGNOSIS — Z79899 Other long term (current) drug therapy: Secondary | ICD-10-CM | POA: Diagnosis not present

## 2018-10-02 DIAGNOSIS — Z803 Family history of malignant neoplasm of breast: Secondary | ICD-10-CM

## 2018-10-02 DIAGNOSIS — Z8042 Family history of malignant neoplasm of prostate: Secondary | ICD-10-CM | POA: Insufficient documentation

## 2018-10-02 DIAGNOSIS — Z800 Family history of malignant neoplasm of digestive organs: Secondary | ICD-10-CM | POA: Diagnosis not present

## 2018-10-02 DIAGNOSIS — Z7982 Long term (current) use of aspirin: Secondary | ICD-10-CM | POA: Diagnosis not present

## 2018-10-02 DIAGNOSIS — C50411 Malignant neoplasm of upper-outer quadrant of right female breast: Secondary | ICD-10-CM

## 2018-10-02 DIAGNOSIS — E7800 Pure hypercholesterolemia, unspecified: Secondary | ICD-10-CM | POA: Diagnosis not present

## 2018-10-09 DIAGNOSIS — L738 Other specified follicular disorders: Secondary | ICD-10-CM | POA: Diagnosis not present

## 2018-10-09 DIAGNOSIS — L820 Inflamed seborrheic keratosis: Secondary | ICD-10-CM | POA: Diagnosis not present

## 2018-10-09 DIAGNOSIS — L821 Other seborrheic keratosis: Secondary | ICD-10-CM | POA: Diagnosis not present

## 2019-01-14 ENCOUNTER — Other Ambulatory Visit: Payer: Self-pay

## 2019-01-15 ENCOUNTER — Other Ambulatory Visit: Payer: Self-pay

## 2019-01-15 ENCOUNTER — Ambulatory Visit (INDEPENDENT_AMBULATORY_CARE_PROVIDER_SITE_OTHER): Payer: Medicare HMO | Admitting: Nurse Practitioner

## 2019-01-15 ENCOUNTER — Encounter: Payer: Self-pay | Admitting: Nurse Practitioner

## 2019-01-15 VITALS — BP 130/69 | HR 83 | Temp 97.5°F | Ht 67.0 in | Wt 155.0 lb

## 2019-01-15 DIAGNOSIS — H8303 Labyrinthitis, bilateral: Secondary | ICD-10-CM | POA: Diagnosis not present

## 2019-01-15 MED ORDER — PREDNISONE 20 MG PO TABS: ORAL_TABLET | ORAL | 0 refills | Status: DC

## 2019-01-15 MED ORDER — MECLIZINE HCL 25 MG PO TABS: 25.0000 mg | ORAL_TABLET | Freq: Three times a day (TID) | ORAL | 0 refills | Status: DC | PRN

## 2019-01-15 NOTE — Progress Notes (Signed)
   Subjective:    Patient ID: Tiffany Dillon, female    DOB: 09/20/1950, 68 y.o.   MRN: [id number]   Chief Complaint: dizziness   HPI Patient come sin today c/o feeling " staggery" she feels like her balance is off. It started over a week ago. Happens several times a day. Denies any nausea or any other symptoms.   Review of Systems  Constitutional: Negative for activity change and appetite change.  HENT: Negative.   Eyes: Negative for pain.  Respiratory: Negative for shortness of breath.   Cardiovascular: Negative for chest pain, palpitations and leg swelling.  Gastrointestinal: Negative for abdominal pain.  Endocrine: Negative for polydipsia.  Genitourinary: Negative.   Skin: Negative for rash.  Neurological: Positive for dizziness. Negative for weakness and headaches.  Hematological: Does not bruise/bleed easily.  Psychiatric/Behavioral: Negative.   All other systems reviewed and are negative.      Objective:   Physical Exam Vitals signs and nursing note reviewed.  Constitutional:      Appearance: Normal appearance.  Cardiovascular:     Rate and Rhythm: Normal rate and regular rhythm.     Pulses: Normal pulses.     Heart sounds: Normal heart sounds.  Pulmonary:     Effort: Pulmonary effort is normal.     Breath sounds: Normal breath sounds.  Skin:    General: Skin is warm.  Neurological:     General: No focal deficit present.     Mental Status: She is alert and oriented to person, place, and time.     Cranial Nerves: No cranial nerve deficit.     Sensory: No sensory deficit.  Psychiatric:        Mood and Affect: Mood normal.        Behavior: Behavior normal.     BP 130/69   Pulse 83   Temp (!) 97.5 F (36.4 C) (Oral)   Ht 5\' 7"  (1.702 m)   Wt 155 lb (70.3 kg)   LMP 04/25/1998   BMI 24.28 kg/m          Assessment & Plan:  Tiffany Dillon in today with chief complaint of Dizziness   1. Labyrinthitis of both ears force fluids  Rest - predniSONE (DELTASONE) 20 MG tablet; 2 po at sametime daily for 5 days  Dispense: 10 tablet; Refill: 0 - meclizine (ANTIVERT) 25 MG tablet; Take 1 tablet (25 mg total) by mouth 3 (three) times daily as needed for dizziness.  Dispense: 30 tablet; Refill: 0  Mary-Margaret Hassell Done, FNP

## 2019-01-15 NOTE — Patient Instructions (Signed)

## 2019-02-01 ENCOUNTER — Encounter: Payer: Self-pay | Admitting: Obstetrics & Gynecology

## 2019-02-19 ENCOUNTER — Other Ambulatory Visit: Payer: Self-pay

## 2019-02-19 ENCOUNTER — Ambulatory Visit (INDEPENDENT_AMBULATORY_CARE_PROVIDER_SITE_OTHER): Payer: Medicare HMO | Admitting: Nurse Practitioner

## 2019-02-19 ENCOUNTER — Encounter: Payer: Self-pay | Admitting: Nurse Practitioner

## 2019-02-19 VITALS — BP 149/72 | HR 93 | Temp 96.8°F | Resp 20 | Ht 67.0 in | Wt 156.0 lb

## 2019-02-19 DIAGNOSIS — R300 Dysuria: Secondary | ICD-10-CM

## 2019-02-19 DIAGNOSIS — Z23 Encounter for immunization: Secondary | ICD-10-CM | POA: Diagnosis not present

## 2019-02-19 LAB — MICROSCOPIC EXAMINATION: Renal Epithel, UA: NONE SEEN /hpf

## 2019-02-19 LAB — URINALYSIS, COMPLETE
Bilirubin, UA: NEGATIVE
Glucose, UA: NEGATIVE
Ketones, UA: NEGATIVE
Nitrite, UA: NEGATIVE
Protein,UA: NEGATIVE
Specific Gravity, UA: <1.005 — ABNORMAL LOW (ref 1.005–1.030)
Urobilinogen, Ur: 0.2 mg/dL (ref 0.2–1.0)
pH, UA: 6 (ref 5.0–7.5)

## 2019-02-19 MED ORDER — AMOXICILLIN 875 MG PO TABS: 875.0000 mg | ORAL_TABLET | Freq: Two times a day (BID) | ORAL | 0 refills | Status: DC

## 2019-02-19 NOTE — Addendum Note (Signed)
Addended by: Chevis Pretty on: 02/19/2019 03:55 PM   Modules accepted: Level of Service

## 2019-02-19 NOTE — Patient Instructions (Signed)
Urinary Tract Infection, Adult A urinary tract infection (UTI) is an infection of any part of the urinary tract. The urinary tract includes:  The kidneys.  The ureters.  The bladder.  The urethra. These organs make, store, and get rid of pee (urine) in the body. What are the causes? This is caused by germs (bacteria) in your genital area. These germs grow and cause swelling (inflammation) of your urinary tract. What increases the risk? You are more likely to develop this condition if:  You have a small, thin tube (catheter) to drain pee.  You cannot control when you pee or poop (incontinence).  You are female, and: ? You use these methods to prevent pregnancy: ? A medicine that kills sperm (spermicide). ? A device that blocks sperm (diaphragm). ? You have low levels of a female hormone (estrogen). ? You are pregnant.  You have genes that add to your risk.  You are sexually active.  You take antibiotic medicines.  You have trouble peeing because of: ? A prostate that is bigger than normal, if you are female. ? A blockage in the part of your body that drains pee from the bladder (urethra). ? A kidney stone. ? A nerve condition that affects your bladder (neurogenic bladder). ? Not getting enough to drink. ? Not peeing often enough.  You have other conditions, such as: ? Diabetes. ? A weak disease-fighting system (immune system). ? Sickle cell disease. ? Gout. ? Injury of the spine. What are the signs or symptoms? Symptoms of this condition include:  Needing to pee right away (urgently).  Peeing often.  Peeing small amounts often.  Pain or burning when peeing.  Blood in the pee.  Pee that smells bad or not like normal.  Trouble peeing.  Pee that is cloudy.  Fluid coming from the vagina, if you are female.  Pain in the belly or lower back. Other symptoms include:  Throwing up (vomiting).  No urge to eat.  Feeling mixed up (confused).  Being tired  and grouchy (irritable).  A fever.  Watery poop (diarrhea). How is this treated? This condition may be treated with:  Antibiotic medicine.  Other medicines.  Drinking enough water. Follow these instructions at home:  Medicines  Take over-the-counter and prescription medicines only as told by your doctor.  If you were prescribed an antibiotic medicine, take it as told by your doctor. Do not stop taking it even if you start to feel better. General instructions  Make sure you: ? Pee until your bladder is empty. ? Do not hold pee for a long time. ? Empty your bladder after sex. ? Wipe from front to back after pooping if you are a female. Use each tissue one time when you wipe.  Drink enough fluid to keep your pee pale yellow.  Keep all follow-up visits as told by your doctor. This is important. Contact a doctor if:  You do not get better after 1-2 days.  Your symptoms go away and then come back. Get help right away if:  You have very bad back pain.  You have very bad pain in your lower belly.  You have a fever.  You are sick to your stomach (nauseous).  You are throwing up. Summary  A urinary tract infection (UTI) is an infection of any part of the urinary tract.  This condition is caused by germs in your genital area.  There are many risk factors for a UTI. These include having a small, thin   tube to drain pee and not being able to control when you pee or poop.  Treatment includes antibiotic medicines for germs.  Drink enough fluid to keep your pee pale yellow. This information is not intended to replace advice given to you by your health care provider. Make sure you discuss any questions you have with your health care provider. Document Released: 09/28/2007 Document Revised: 03/29/2018 Document Reviewed: 10/19/2017 Elsevier Patient Education  2020 Elsevier Inc.  

## 2019-02-19 NOTE — Progress Notes (Signed)
   Subjective:    Patient ID: Tiffany Dillon, female    DOB: 13-Dec-1950, 68 y.o.   MRN: [id number]   Chief Complaint: Dysuria   HPI Patient woke up this mornig with dysuria and frequency today. She has a history of UTI.      Review of Systems  Constitutional: Negative for activity change, appetite change, chills and fever.  Respiratory: Negative.   Cardiovascular: Negative.   Genitourinary: Positive for dysuria, frequency and urgency. Negative for flank pain.  Musculoskeletal: Negative for back pain.  Psychiatric/Behavioral: Negative.   All other systems reviewed and are negative.      Objective:   Physical Exam Vitals signs and nursing note reviewed.  Constitutional:      Appearance: Normal appearance.  Cardiovascular:     Rate and Rhythm: Normal rate and regular rhythm.     Pulses: Normal pulses.     Heart sounds: Normal heart sounds.  Abdominal:     General: Abdomen is flat.     Palpations: Abdomen is soft.     Tenderness: There is no abdominal tenderness. There is no right CVA tenderness or left CVA tenderness.  Skin:    General: Skin is warm and dry.  Neurological:     General: No focal deficit present.     Mental Status: She is alert and oriented to person, place, and time.  Psychiatric:        Mood and Affect: Mood normal.        Behavior: Behavior normal.    BP (!) 149/72   Pulse 93   Temp (!) 96.8 F (36 C) (Temporal)   Resp 20   Ht 5\' 7"  (1.702 m)   Wt 156 lb (70.8 kg)   LMP 04/25/1998   SpO2 100%   BMI 24.43 kg/m         Assessment & Plan:  Tiffany Dillon in today with chief complaint of Dysuria   1. Dysuria Take medication as prescribe Cotton underwear Take shower not bath Cranberry juice, yogurt Force fluids AZO over the counter X2 days Culture pending RTO prn  - Urinalysis, Complete - amoxicillin (AMOXIL) 875 MG tablet; Take 1 tablet (875 mg total) by mouth 2 (two) times daily.  Dispense: 20 tablet; Refill:  0 - Urine Culture  Mary-Margaret Hassell Done, FNP

## 2019-02-21 LAB — URINE CULTURE

## 2019-03-06 ENCOUNTER — Ambulatory Visit (INDEPENDENT_AMBULATORY_CARE_PROVIDER_SITE_OTHER): Payer: Medicare HMO | Admitting: *Deleted

## 2019-03-06 DIAGNOSIS — Z0000 Encounter for general adult medical examination without abnormal findings: Secondary | ICD-10-CM
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
## 2019-03-06 NOTE — Patient Instructions (Signed)
Preventive Care 38 Years and Older, Female Preventive care refers to lifestyle choices and visits with your health care provider that can promote health and wellness. This includes:  A yearly physical exam. This is also called an annual well check.  Regular dental and eye exams.  Immunizations.  Screening for certain conditions.  Healthy lifestyle choices, such as diet and exercise. What can I expect for my preventive care visit? Physical exam Your health care provider will check:  Height and weight. These may be used to calculate body mass index (BMI), which is a measurement that tells if you are at a healthy weight.  Heart rate and blood pressure.  Your skin for abnormal spots. Counseling Your health care provider may ask you questions about:  Alcohol, tobacco, and drug use.  Emotional well-being.  Home and relationship well-being.  Sexual activity.  Eating habits.  History of falls.  Memory and ability to understand (cognition).  Work and work Statistician.  Pregnancy and menstrual history. What immunizations do I need?  Influenza (flu) vaccine  This is recommended every year. Tetanus, diphtheria, and pertussis (Tdap) vaccine  You may need a Td booster every 10 years. Varicella (chickenpox) vaccine  You may need this vaccine if you have not already been vaccinated. Zoster (shingles) vaccine  You may need this after age 33. Pneumococcal conjugate (PCV13) vaccine  One dose is recommended after age 33. Pneumococcal polysaccharide (PPSV23) vaccine  One dose is recommended after age 72. Measles, mumps, and rubella (MMR) vaccine  You may need at least one dose of MMR if you were born in 1957 or later. You may also need a second dose. Meningococcal conjugate (MenACWY) vaccine  You may need this if you have certain conditions. Hepatitis A vaccine  You may need this if you have certain conditions or if you travel or work in places where you may be exposed  to hepatitis A. Hepatitis B vaccine  You may need this if you have certain conditions or if you travel or work in places where you may be exposed to hepatitis B. Haemophilus influenzae type b (Hib) vaccine  You may need this if you have certain conditions. You may receive vaccines as individual doses or as more than one vaccine together in one shot (combination vaccines). Talk with your health care provider about the risks and benefits of combination vaccines. What tests do I need? Blood tests  Lipid and cholesterol levels. These may be checked every 5 years, or more frequently depending on your overall health.  Hepatitis C test.  Hepatitis B test. Screening  Lung cancer screening. You may have this screening every year starting at age 39 if you have a 30-pack-year history of smoking and currently smoke or have quit within the past 15 years.  Colorectal cancer screening. All adults should have this screening starting at age 36 and continuing until age 15. Your health care provider may recommend screening at age 23 if you are at increased risk. You will have tests every 1-10 years, depending on your results and the type of screening test.  Diabetes screening. This is done by checking your blood sugar (glucose) after you have not eaten for a while (fasting). You may have this done every 1-3 years.  Mammogram. This may be done every 1-2 years. Talk with your health care provider about how often you should have regular mammograms.  BRCA-related cancer screening. This may be done if you have a family history of breast, ovarian, tubal, or peritoneal cancers.  Other tests  Sexually transmitted disease (STD) testing.  Bone density scan. This is done to screen for osteoporosis. You may have this done starting at age 76. Follow these instructions at home: Eating and drinking  Eat a diet that includes fresh fruits and vegetables, whole grains, lean protein, and low-fat dairy products. Limit  your intake of foods with high amounts of sugar, saturated fats, and salt.  Take vitamin and mineral supplements as recommended by your health care provider.  Do not drink alcohol if your health care provider tells you not to drink.  If you drink alcohol: ? Limit how much you have to 0-1 drink a day. ? Be aware of how much alcohol is in your drink. In the [place] one drink equals one 12 oz bottle of beer (355 mL), one 5 oz glass of wine (148 mL), or one 1 oz glass of hard liquor (44 mL). Lifestyle  Take daily care of your teeth and gums.  Stay active. Exercise for at least 30 minutes on 5 or more days each week.  Do not use any products that contain nicotine or tobacco, such as cigarettes, e-cigarettes, and chewing tobacco. If you need help quitting, ask your health care provider.  If you are sexually active, practice safe sex. Use a condom or other form of protection in order to prevent STIs (sexually transmitted infections).  Talk with your health care provider about taking a low-dose aspirin or statin. What's next?  Go to your health care provider once a year for a well check visit.  Ask your health care provider how often you should have your eyes and teeth checked.  Stay up to date on all vaccines. This information is not intended to replace advice given to you by your health care provider. Make sure you discuss any questions you have with your health care provider. Document Released: 05/08/2015 Document Revised: 04/05/2018 Document Reviewed: 04/05/2018 Elsevier Patient Education  2020 [employer].

## 2019-04-26 ENCOUNTER — Encounter: Payer: Self-pay | Admitting: Family Medicine

## 2019-04-30 ENCOUNTER — Encounter: Payer: Self-pay | Admitting: Family

## 2019-04-30 ENCOUNTER — Ambulatory Visit (INDEPENDENT_AMBULATORY_CARE_PROVIDER_SITE_OTHER): Payer: Medicare HMO | Admitting: Family

## 2019-04-30 DIAGNOSIS — U071 COVID-19: Secondary | ICD-10-CM | POA: Diagnosis not present

## 2019-04-30 DIAGNOSIS — A0839 Other viral enteritis: Secondary | ICD-10-CM

## 2019-04-30 NOTE — Progress Notes (Signed)
   Virtual Visit via telephone Note Due to COVID-19 pandemic this visit was conducted virtually. This visit type was conducted due to national recommendations for restrictions regarding the COVID-19 Pandemic (e.g. social distancing, sheltering in place) in an effort to limit this patient's exposure and mitigate transmission in our community. All issues noted in this document were discussed and addressed.  A physical exam was not performed with this format.  I connected with Tiffany Dillon on 04/30/19 at 1:15 pm  by telephone and verified that I am speaking with the correct person using two identifiers. Tiffany Dillon is currently located at home and no one  is currently with her during visit. The provider, Evelina Dun, FNP is located in their office at time of visit.  I discussed the limitations, risks, security and privacy concerns of performing an evaluation and management service by telephone and the availability of in person appointments. I also discussed with the patient that there may be a patient responsible charge related to this service. The patient expressed understanding and agreed to proceed.   History and Present Illness:  Pt calls the office today with complaints of diarrhea. She reports she was diagnosed with COVID about 2 weeks ago. She states her taste is back, appetite, and fatigue have improve. Denies any fever.   Diarrhea  This is a new problem. The current episode started 1 to 4 weeks ago. The problem occurs less than 2 times per day. The problem has been waxing and waning. The stool consistency is described as watery. Pertinent negatives include no bloating, chills, coughing, fever, headaches, increased  flatus, myalgias, vomiting or weight loss. She has tried anti-motility drug and change of diet for the symptoms. The treatment provided mild relief.      Review of Systems  Constitutional: Negative for chills, fever and weight loss.  Respiratory: Negative  for cough.   Gastrointestinal: Positive for diarrhea. Negative for bloating, flatus and vomiting.  Musculoskeletal: Negative for myalgias.  Neurological: Negative for headaches.  All other systems reviewed and are negative.    Observations/Objective: No SOB or distress noted   Assessment and Plan: 1. Diarrhea due to COVID-19 Start Imodium as needed Force fluids  Brat diet Continue probiotic  Call if symptoms worsen or do not improve      I discussed the assessment and treatment plan with the patient. The patient was provided an opportunity to ask questions and all were answered. The patient agreed with the plan and demonstrated an understanding of the instructions.   The patient was advised to call back or seek an in-person evaluation if the symptoms worsen or if the condition fails to improve as anticipated.  The above assessment and management plan was discussed with the patient. The patient verbalized understanding of and has agreed to the management plan. Patient is aware to call the clinic if symptoms persist or worsen. Patient is aware when to return to the clinic for a follow-up visit. Patient educated on when it is appropriate to go to the emergency department.   Time call ended:  1:29 pm  I provided 14 minutes of non-face-to-face time during this encounter.    Evelina Dun, FNP

## 2019-06-04 DIAGNOSIS — R69 Illness, unspecified: Secondary | ICD-10-CM | POA: Diagnosis not present

## 2019-06-26 ENCOUNTER — Other Ambulatory Visit: Payer: Self-pay

## 2019-06-27 ENCOUNTER — Encounter: Payer: Self-pay | Admitting: Obstetrics & Gynecology

## 2019-06-27 ENCOUNTER — Ambulatory Visit (INDEPENDENT_AMBULATORY_CARE_PROVIDER_SITE_OTHER): Payer: Medicare HMO | Admitting: Obstetrics & Gynecology

## 2019-06-27 ENCOUNTER — Other Ambulatory Visit (HOSPITAL_COMMUNITY)
Admission: RE | Admit: 2019-06-27 | Discharge: 2019-06-27 | Disposition: A | Discharge status: Home / Self Care | Payer: Medicare HMO | Source: Ambulatory Visit | Attending: Obstetrics & Gynecology | Admitting: Obstetrics & Gynecology

## 2019-06-27 VITALS — BP 132/78 | HR 84 | Temp 95.5°F | Resp 10 | Ht 68.0 in | Wt 154.0 lb

## 2019-06-27 DIAGNOSIS — Z124 Encounter for screening for malignant neoplasm of cervix: Secondary | ICD-10-CM

## 2019-06-27 DIAGNOSIS — T452X1A Poisoning by vitamins, accidental (unintentional), initial encounter: Secondary | ICD-10-CM | POA: Diagnosis not present

## 2019-06-27 DIAGNOSIS — E7800 Pure hypercholesterolemia, unspecified: Secondary | ICD-10-CM | POA: Diagnosis not present

## 2019-06-27 DIAGNOSIS — L659 Nonscarring hair loss, unspecified: Secondary | ICD-10-CM | POA: Diagnosis not present

## 2019-06-27 DIAGNOSIS — Z01419 Encounter for gynecological examination (general) (routine) without abnormal findings: Secondary | ICD-10-CM | POA: Diagnosis not present
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
## 2019-06-27 NOTE — Addendum Note (Signed)
Addended by: Megan Salon on: 06/27/2019 09:27 AM   Modules accepted: Orders

## 2019-06-28 LAB — COMPREHENSIVE METABOLIC PANEL
ALT: 30 IU/L (ref 0–32)
AST: 28 IU/L (ref 0–40)
Albumin/Globulin Ratio: 2.1 (ref 1.2–2.2)
Albumin: 4.6 g/dL (ref 3.8–4.8)
Alkaline Phosphatase: 76 IU/L (ref 39–117)
BUN/Creatinine Ratio: 18 (ref 12–28)
BUN: 15 mg/dL (ref 8–27)
Bilirubin Total: 0.6 mg/dL (ref 0.0–1.2)
CO2: 26 mmol/L (ref 20–29)
Calcium: 9.4 mg/dL (ref 8.7–10.3)
Chloride: 100 mmol/L (ref 96–106)
Creatinine, Ser: 0.84 mg/dL (ref 0.57–1.00)
GFR calc Af Amer: 82 mL/min/{1.73_m2} (ref >59)
GFR calc non Af Amer: 71 mL/min/{1.73_m2} (ref >59)
Globulin, Total: 2.2 g/dL (ref 1.5–4.5)
Glucose: 76 mg/dL (ref 65–99)
Potassium: 4.8 mmol/L (ref 3.5–5.2)
Sodium: 140 mmol/L (ref 134–144)
Total Protein: 6.8 g/dL (ref 6.0–8.5)

## 2019-06-28 LAB — CBC
Hematocrit: 46.7 % — ABNORMAL HIGH (ref 34.0–46.6)
Hemoglobin: 15.3 g/dL (ref 11.1–15.9)
MCH: 31.7 pg (ref 26.6–33.0)
MCHC: 32.8 g/dL (ref 31.5–35.7)
MCV: 97 fL (ref 79–97)
Platelets: 214 10*3/uL (ref 150–450)
RBC: 4.83 x10E6/uL (ref 3.77–5.28)
RDW: 12.1 % (ref 11.7–15.4)
WBC: 5.2 10*3/uL (ref 3.4–10.8)

## 2019-06-28 LAB — LIPID PANEL
Chol/HDL Ratio: 2.4 ratio (ref 0.0–4.4)
Cholesterol, Total: 194 mg/dL (ref 100–199)
HDL: 81 mg/dL (ref >39)
LDL Chol Calc (NIH): 100 mg/dL — ABNORMAL HIGH (ref 0–99)
Triglycerides: 69 mg/dL (ref 0–149)
VLDL Cholesterol Cal: 13 mg/dL (ref 5–40)

## 2019-06-28 LAB — CYTOLOGY - PAP: Diagnosis: NEGATIVE

## 2019-06-28 LAB — VITAMIN D 25 HYDROXY (VIT D DEFICIENCY, FRACTURES): Vit D, 25-Hydroxy: 115 ng/mL — ABNORMAL HIGH (ref 30.0–100.0)

## 2019-06-28 LAB — TSH: TSH: 3 u[IU]/mL (ref 0.450–4.500)

## 2019-06-28 NOTE — Addendum Note (Signed)
Addended by: Megan Salon on: 06/28/2019 01:08 PM   Modules accepted: Orders

## 2019-07-17 DIAGNOSIS — H25013 Cortical age-related cataract, bilateral: Secondary | ICD-10-CM | POA: Diagnosis not present

## 2019-07-17 DIAGNOSIS — H2513 Age-related nuclear cataract, bilateral: Secondary | ICD-10-CM | POA: Diagnosis not present

## 2019-07-17 DIAGNOSIS — H43813 Vitreous degeneration, bilateral: Secondary | ICD-10-CM | POA: Diagnosis not present

## 2019-07-17 DIAGNOSIS — H524 Presbyopia: Secondary | ICD-10-CM | POA: Diagnosis not present

## 2019-07-30 ENCOUNTER — Encounter: Payer: Self-pay | Admitting: Oncology

## 2019-08-06 DIAGNOSIS — L650 Telogen effluvium: Secondary | ICD-10-CM | POA: Diagnosis not present

## 2019-08-06 DIAGNOSIS — L821 Other seborrheic keratosis: Secondary | ICD-10-CM | POA: Diagnosis not present

## 2019-08-06 DIAGNOSIS — C4441 Basal cell carcinoma of skin of scalp and neck: Secondary | ICD-10-CM | POA: Diagnosis not present

## 2019-08-06 DIAGNOSIS — D225 Melanocytic nevi of trunk: Secondary | ICD-10-CM | POA: Diagnosis not present

## 2019-08-06 DIAGNOSIS — D1801 Hemangioma of skin and subcutaneous tissue: Secondary | ICD-10-CM | POA: Diagnosis not present

## 2019-08-06 DIAGNOSIS — D2272 Melanocytic nevi of left lower limb, including hip: Secondary | ICD-10-CM | POA: Diagnosis not present

## 2019-08-19 ENCOUNTER — Other Ambulatory Visit: Payer: Self-pay | Admitting: Oncology

## 2019-08-19 DIAGNOSIS — Z853 Personal history of malignant neoplasm of breast: Secondary | ICD-10-CM

## 2019-08-27 DIAGNOSIS — Z85828 Personal history of other malignant neoplasm of skin: Secondary | ICD-10-CM | POA: Diagnosis not present

## 2019-08-27 DIAGNOSIS — C44319 Basal cell carcinoma of skin of other parts of face: Secondary | ICD-10-CM | POA: Diagnosis not present

## 2019-09-05 ENCOUNTER — Telehealth: Payer: Self-pay | Admitting: Oncology

## 2019-09-05 NOTE — Telephone Encounter (Signed)
R/s appt per 5/12 schmessage - unable to reach pt . Left message with appt date and time

## 2019-09-13 ENCOUNTER — Other Ambulatory Visit: Payer: Self-pay

## 2019-09-16 ENCOUNTER — Other Ambulatory Visit (INDEPENDENT_AMBULATORY_CARE_PROVIDER_SITE_OTHER): Payer: Medicare HMO

## 2019-09-16 ENCOUNTER — Other Ambulatory Visit: Payer: Self-pay

## 2019-09-16 ENCOUNTER — Telehealth: Payer: Self-pay | Admitting: *Deleted

## 2019-09-16 DIAGNOSIS — Z0000 Encounter for general adult medical examination without abnormal findings: Secondary | ICD-10-CM

## 2019-09-16 DIAGNOSIS — E559 Vitamin D deficiency, unspecified: Secondary | ICD-10-CM | POA: Diagnosis not present

## 2019-09-16 DIAGNOSIS — T452X1A Poisoning by vitamins, accidental (unintentional), initial encounter: Secondary | ICD-10-CM | POA: Diagnosis not present

## 2019-09-16 NOTE — Telephone Encounter (Signed)
Order placed.   Lab notified.   Encounter closed.

## 2019-09-16 NOTE — Telephone Encounter (Signed)
Tiffany Dillon, CMA  06/28/2019 4:22 PM EST    The following results were reviewed in detail with patient as written by provider. Patient verbalizes understanding and agreeable to plan. Patient requests having an A1C done when she returns for recheck Vitamin D. Please advise and add future lab order.    Megan Salon, MD  06/28/2019 1:08 PM EST    Please let pt know her pap was normal. 02 recall. Her Vit D is quite high at 115. She needs to stop her 5000 IU daily and recheck this in 2-3 months to make sure it is in the more normal range. Future order placed.   TSH is normal. Cholesterol looks good. Total 104. LDL 100. No treatment needed. CMP is normal. CBC is normal    02/16/18 HgbA1c , 5.1 06/27/19 Glucose,  76  Dr. Sabra Heck -patient is scheduled today for repeat Vit D labs, ok to proceed with HgbA1c requested by patient?

## 2019-09-16 NOTE — Telephone Encounter (Signed)
Yes, this is fine. Thanks!

## 2019-09-17 LAB — VITAMIN D 25 HYDROXY (VIT D DEFICIENCY, FRACTURES): Vit D, 25-Hydroxy: 62.5 ng/mL (ref 30.0–100.0)

## 2019-09-17 LAB — HEMOGLOBIN A1C
Est. average glucose Bld gHb Est-mCnc: 103 mg/dL
Hgb A1c MFr Bld: 5.2 % (ref 4.8–5.6)

## 2019-09-24 ENCOUNTER — Other Ambulatory Visit: Payer: Self-pay

## 2019-09-24 ENCOUNTER — Ambulatory Visit
Admission: RE | Admit: 2019-09-24 | Discharge: 2019-09-24 | Disposition: A | Discharge status: Home / Self Care | Payer: Medicare HMO | Source: Ambulatory Visit | Attending: Oncology | Admitting: Oncology

## 2019-09-24 DIAGNOSIS — Z853 Personal history of malignant neoplasm of breast: Secondary | ICD-10-CM | POA: Diagnosis not present

## 2019-09-24 DIAGNOSIS — R928 Other abnormal and inconclusive findings on diagnostic imaging of breast: Secondary | ICD-10-CM | POA: Diagnosis not present

## 2019-10-02 ENCOUNTER — Ambulatory Visit: Payer: Medicare HMO | Admitting: Oncology

## 2019-11-14 ENCOUNTER — Inpatient Hospital Stay: Payer: Medicare HMO | Attending: Oncology | Admitting: Oncology

## 2019-11-14 ENCOUNTER — Other Ambulatory Visit: Payer: Self-pay

## 2019-11-14 VITALS — BP 149/69 | HR 86 | Temp 98.3°F | Resp 18 | Ht 68.0 in | Wt 152.3 lb

## 2019-11-14 DIAGNOSIS — M1990 Unspecified osteoarthritis, unspecified site: Secondary | ICD-10-CM | POA: Insufficient documentation

## 2019-11-14 DIAGNOSIS — E7800 Pure hypercholesterolemia, unspecified: Secondary | ICD-10-CM | POA: Insufficient documentation

## 2019-11-14 DIAGNOSIS — Z803 Family history of malignant neoplasm of breast: Secondary | ICD-10-CM | POA: Insufficient documentation

## 2019-11-14 DIAGNOSIS — Z79899 Other long term (current) drug therapy: Secondary | ICD-10-CM | POA: Insufficient documentation

## 2019-11-14 DIAGNOSIS — Z170 Estrogen receptor positive status [ER+]: Secondary | ICD-10-CM | POA: Diagnosis not present

## 2019-11-14 DIAGNOSIS — D0591 Unspecified type of carcinoma in situ of right breast: Secondary | ICD-10-CM | POA: Insufficient documentation

## 2019-11-14 DIAGNOSIS — C50411 Malignant neoplasm of upper-outer quadrant of right female breast: Secondary | ICD-10-CM | POA: Diagnosis not present
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
## 2019-11-15 ENCOUNTER — Telehealth: Payer: Self-pay | Admitting: Oncology

## 2019-11-15 NOTE — Telephone Encounter (Signed)
No 7/22 los. No changes made to pt's schedule.  

## 2019-12-14 ENCOUNTER — Encounter: Payer: Self-pay | Admitting: Oncology

## 2020-01-16 DIAGNOSIS — R69 Illness, unspecified: Secondary | ICD-10-CM | POA: Diagnosis not present

## 2020-02-22 DIAGNOSIS — R69 Illness, unspecified: Secondary | ICD-10-CM | POA: Diagnosis not present

## 2020-05-14 ENCOUNTER — Telehealth: Payer: Medicare HMO | Admitting: Family

## 2020-05-14 DIAGNOSIS — N390 Urinary tract infection, site not specified: Secondary | ICD-10-CM | POA: Diagnosis not present

## 2020-05-14 MED ORDER — CEPHALEXIN 500 MG PO CAPS: 500.0000 mg | ORAL_CAPSULE | Freq: Two times a day (BID) | ORAL | 0 refills | Status: DC

## 2020-05-14 NOTE — Progress Notes (Signed)
We are sorry that you are not feeling well.  Here is how we plan to help!  Based on what you shared with me it looks like you most likely have a simple urinary tract infection.  A UTI (Urinary Tract Infection) is a bacterial infection of the bladder.  Most cases of urinary tract infections are simple to treat but a key part of your care is to encourage you to drink plenty of fluids and watch your symptoms carefully.  I have prescribed Keflex 500 mg twice a day for 7 days.  Your symptoms should gradually improve. Call us if the burning in your urine worsens, you develop worsening fever, back pain or pelvic pain or if your symptoms do not resolve after completing the antibiotic.  Urinary tract infections can be prevented by drinking plenty of water to keep your body hydrated.  Also be sure when you wipe, wipe from front to back and don't hold it in!  If possible, empty your bladder every 4 hours.  Your e-visit answers were reviewed by a board certified advanced clinical practitioner to complete your personal care plan.  Depending on the condition, your plan could have included both over the counter or prescription medications.  If there is a problem please reply  once you have received a response from your provider.  Your safety is important to [place].  If you have drug allergies check your prescription carefully.    You can use MyChart to ask questions about today's visit, request a non-urgent call back, or ask for a work or school excuse for 24 hours related to this e-Visit. If it has been greater than 24 hours you will need to follow up with your provider, or enter a new e-Visit to address those concerns.   You will get an e-mail in the next two days asking about your experience.  I hope that your e-visit has been valuable and will speed your recovery. Thank you for using e-visits.   Greater than 5 minutes, yet less than 10 minutes of time have been spent researching, coordinating, and  implementing care for this patient.

## 2020-05-15 ENCOUNTER — Ambulatory Visit: Payer: Medicare HMO | Admitting: Family Medicine

## 2020-05-25 DIAGNOSIS — D225 Melanocytic nevi of trunk: Secondary | ICD-10-CM | POA: Diagnosis not present

## 2020-05-25 DIAGNOSIS — Z419 Encounter for procedure for purposes other than remedying health state, unspecified: Secondary | ICD-10-CM | POA: Diagnosis not present

## 2020-05-25 DIAGNOSIS — Z85828 Personal history of other malignant neoplasm of skin: Secondary | ICD-10-CM | POA: Diagnosis not present

## 2020-05-25 DIAGNOSIS — L821 Other seborrheic keratosis: Secondary | ICD-10-CM | POA: Diagnosis not present

## 2020-06-29 ENCOUNTER — Encounter (HOSPITAL_BASED_OUTPATIENT_CLINIC_OR_DEPARTMENT_OTHER): Payer: Self-pay | Admitting: Obstetrics & Gynecology

## 2020-06-29 ENCOUNTER — Other Ambulatory Visit: Payer: Self-pay

## 2020-06-29 ENCOUNTER — Ambulatory Visit (INDEPENDENT_AMBULATORY_CARE_PROVIDER_SITE_OTHER): Payer: Medicare HMO | Admitting: Obstetrics & Gynecology

## 2020-06-29 ENCOUNTER — Other Ambulatory Visit (HOSPITAL_BASED_OUTPATIENT_CLINIC_OR_DEPARTMENT_OTHER)
Admission: RE | Admit: 2020-06-29 | Discharge: 2020-06-29 | Disposition: A | Discharge status: Home / Self Care | Payer: Medicare HMO | Source: Ambulatory Visit | Attending: Obstetrics & Gynecology | Admitting: Obstetrics & Gynecology

## 2020-06-29 VITALS — BP 138/80 | HR 67 | Resp 19 | Ht 68.5 in | Wt 150.0 lb

## 2020-06-29 DIAGNOSIS — Z853 Personal history of malignant neoplasm of breast: Secondary | ICD-10-CM | POA: Diagnosis not present

## 2020-06-29 DIAGNOSIS — Z0000 Encounter for general adult medical examination without abnormal findings: Secondary | ICD-10-CM

## 2020-06-29 DIAGNOSIS — D0511 Intraductal carcinoma in situ of right breast: Secondary | ICD-10-CM | POA: Insufficient documentation

## 2020-06-29 DIAGNOSIS — E7800 Pure hypercholesterolemia, unspecified: Secondary | ICD-10-CM | POA: Insufficient documentation

## 2020-06-29 DIAGNOSIS — Z789 Other specified health status: Secondary | ICD-10-CM

## 2020-06-29 DIAGNOSIS — D649 Anemia, unspecified: Secondary | ICD-10-CM | POA: Diagnosis not present

## 2020-06-29 DIAGNOSIS — Z01419 Encounter for gynecological examination (general) (routine) without abnormal findings: Secondary | ICD-10-CM | POA: Diagnosis not present

## 2020-06-29 DIAGNOSIS — N952 Postmenopausal atrophic vaginitis: Secondary | ICD-10-CM | POA: Diagnosis not present

## 2020-06-29 LAB — COMPREHENSIVE METABOLIC PANEL
ALT: 18 U/L (ref 0–44)
AST: 20 U/L (ref 15–41)
Albumin: 4.3 g/dL (ref 3.5–5.0)
Alkaline Phosphatase: 56 U/L (ref 38–126)
Anion gap: 8 (ref 5–15)
BUN: 14 mg/dL (ref 8–23)
CO2: 27 mmol/L (ref 22–32)
Calcium: 9.3 mg/dL (ref 8.9–10.3)
Chloride: 104 mmol/L (ref 98–111)
Creatinine, Ser: 0.8 mg/dL (ref 0.44–1.00)
GFR, Estimated: >60 mL/min (ref >60)
Glucose, Bld: 94 mg/dL (ref 70–99)
Potassium: 3.6 mmol/L (ref 3.5–5.1)
Sodium: 139 mmol/L (ref 135–145)
Total Bilirubin: 0.7 mg/dL (ref 0.3–1.2)
Total Protein: 6.8 g/dL (ref 6.5–8.1)

## 2020-06-29 LAB — LIPID PANEL
Cholesterol: 195 mg/dL (ref 0–200)
HDL: 68 mg/dL (ref >40)
LDL Cholesterol: 113 mg/dL — ABNORMAL HIGH (ref 0–99)
Total CHOL/HDL Ratio: 2.9 RATIO
Triglycerides: 68 mg/dL (ref <150)
VLDL: 14 mg/dL (ref 0–40)

## 2020-06-29 LAB — FERRITIN: Ferritin: 39 ng/mL (ref 11–307)

## 2020-06-29 LAB — CBC
HCT: 45.2 % (ref 36.0–46.0)
Hemoglobin: 14.6 g/dL (ref 12.0–15.0)
MCH: 31.8 pg (ref 26.0–34.0)
MCHC: 32.3 g/dL (ref 30.0–36.0)
MCV: 98.5 fL (ref 80.0–100.0)
Platelets: 215 10*3/uL (ref 150–400)
RBC: 4.59 MIL/uL (ref 3.87–5.11)
RDW: 12.9 % (ref 11.5–15.5)
WBC: 5.4 10*3/uL (ref 4.0–10.5)
nRBC: 0 % (ref 0.0–0.2)

## 2020-06-29 LAB — HEMOGLOBIN A1C
Hgb A1c MFr Bld: 5.3 % (ref 4.8–5.6)
Mean Plasma Glucose: 105.41 mg/dL

## 2020-06-29 LAB — VITAMIN D 25 HYDROXY (VIT D DEFICIENCY, FRACTURES): Vit D, 25-Hydroxy: 82.73 ng/mL (ref 30–100)
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
## 2020-06-30 ENCOUNTER — Encounter (HOSPITAL_BASED_OUTPATIENT_CLINIC_OR_DEPARTMENT_OTHER): Payer: Self-pay

## 2020-07-01 ENCOUNTER — Encounter (HOSPITAL_BASED_OUTPATIENT_CLINIC_OR_DEPARTMENT_OTHER): Payer: Self-pay

## 2020-07-03 ENCOUNTER — Encounter (HOSPITAL_BASED_OUTPATIENT_CLINIC_OR_DEPARTMENT_OTHER): Payer: Self-pay | Admitting: Obstetrics & Gynecology

## 2020-07-17 DIAGNOSIS — H43813 Vitreous degeneration, bilateral: Secondary | ICD-10-CM | POA: Diagnosis not present

## 2020-07-17 DIAGNOSIS — H2513 Age-related nuclear cataract, bilateral: Secondary | ICD-10-CM | POA: Diagnosis not present

## 2020-07-17 DIAGNOSIS — H35373 Puckering of macula, bilateral: Secondary | ICD-10-CM | POA: Diagnosis not present

## 2020-07-17 DIAGNOSIS — H524 Presbyopia: Secondary | ICD-10-CM | POA: Diagnosis not present

## 2020-08-09 ENCOUNTER — Telehealth: Payer: Medicare HMO | Admitting: Physician Assistant

## 2020-08-09 DIAGNOSIS — S0086XA Insect bite (nonvenomous) of other part of head, initial encounter: Secondary | ICD-10-CM

## 2020-08-09 DIAGNOSIS — W57XXXA Bitten or stung by nonvenomous insect and other nonvenomous arthropods, initial encounter: Secondary | ICD-10-CM

## 2020-08-09 NOTE — Progress Notes (Signed)
Thank you for describing your tick bite, Here is how we plan to help! Based on the information that you shared with me it looks like you have An infected or complicated tick bite that requires a longer course of antibiotics and will need for you to schedule a follow-up visit with a provider.    In most cases a tick bite is painless and does not itch.  Most tick bites in which the tick is quickly removed do not require prescriptions. Ticks can transmit several diseases if they are infected and remain attacked to your skin. Therefore the length that the tick was attached and any symptoms you have experienced after the bite are import to accurately develop your custom treatment plan. In most cases a single dose of doxycycline may prevent the development of a more serious condition.  Based on your information I have Provided a home care guide for tick bites and  instructions on when to call for help. and Because you are allergic to doxycycline, the center for disease control recommends that we monitor you closely for the next two weeks. No other antibiotic has been approved for this.  Which ticks  are associated with illness?  The Wood Tick (dog tick) is the size of a watermelon seed and can sometimes transmit [facility] spotted fever and [place] tick fever.   The Deer Tick (black-legged tick) is between the size of a poppy seed (pin head) and an apple seed, and can sometimes transmit Lyme disease.  A brown to black tick with a white splotch on its back is likely a female Amblyomma americanum (Lone Star tick). This tick has been associated with [place] Tick Associated illness ( STARI)  Lyme disease has become the most common tick-borne illness in the [place]. The risk of Lyme disease following a recognized deer tick bite is estimated to be 1%.  The majority of cases of Lyme disease start with a bull's eye rash at the site of the tick bite. The rash can occur days to weeks (typically 7-10  days) after a tick bite. Treatment with antibiotics is indicated if this rash appears. Flu-like symptoms may accompany the rash, including: fever, chills, headaches, muscle aches, and fatigue. Removing ticks promptly may prevent tick borne disease.  What can be used to prevent Tick Bites?   Insect repellant with at leas 20% DEET.  Wearing long pants with sock and shoes.  Avoiding tall grass and heavily wooded areas.  Checking your skin after being outdoors.  Shower with a washcloth after outdoor exposures.  HOME CARE ADVICE FOR TICK BITE  1. Wood Tick Removal:  o Use a pair of tweezers and grasp the wood tick close to the skin (on its head). Pull the wood tick straight upward without twisting or crushing it. Maintain a steady pressure until it releases its grip.   o If tweezers aren't available, use fingers, a loop of thread around the jaws, or a needle between the jaws for traction.  o Note: covering the tick with petroleum jelly, nail polish or rubbing alcohol doesn't work. Neither does touching the tick with a hot or cold object. 2. Tiny Deer Tick Removal:   o Needs to be scraped off with a knife blade or credit card edge. o Place tick in a sealed container (e.g. glass jar, zip lock plastic bag), in case your doctor wants to see it. 3. Tick's Head Removal:  o If the wood tick's head breaks off in the skin, it must  be removed. Clean the skin. Then use a sterile needle to uncover the head and lift it out or scrape it off.  o If a very small piece of the head remains, the skin will eventually slough it off. 4. Antibiotic Ointment:  o Wash the wound and your hands with soap and water after removal to prevent catching any tick disease.  Apply an over the counter antibiotic ointment (e.g. bacitracin) to the bite once. 5. Expected Course: Tick bites normally don't itch or hurt. That's why they often go unnoticed. 6. Call Your Doctor If:  o You can't remove the tick or the tick's  head o Fever, a severe head ache, or rash occur in the next 2 weeks o Bite begins to look infected o Lyme's disease is common in your area o You have not had a tetanus in the last 10 years o Your current symptoms become worse    MAKE SURE YOU   Understand these instructions.  Will watch your condition.  Will get help right away if you are not doing well or get worse.   Thank you for choosing an e-visit.  Your e-visit answers were reviewed by a board certified advanced clinical practitioner to complete your personal care plan. Depending upon the condition, your plan could have included both over the counter or prescription medications. Please review your pharmacy choice. If there is a problem you may use MyChart messaging to have the prescription routed to another pharmacy. Your safety is important to [place]. If you have drug allergies check your prescription carefully.   You can use MyChart to ask questions about today's visit, request a non-urgent call back, or ask for a work or school excuse for 24 hours related to this e-Visit. If it has been greater than 24 hours you will need to follow up with your provider, or enter a new e-Visit to address those concerns.  You will get an email in the next two days asking about your experience. I hope  that your e-visit has been valuable and will speed your recovery I spent 5-10 minutes on review and completion of this note- Lacy Duverney [facility]

## 2020-08-10 ENCOUNTER — Encounter: Payer: Self-pay | Admitting: Family Medicine

## 2020-08-10 ENCOUNTER — Ambulatory Visit (INDEPENDENT_AMBULATORY_CARE_PROVIDER_SITE_OTHER): Payer: Medicare HMO | Admitting: Family Medicine

## 2020-08-10 ENCOUNTER — Other Ambulatory Visit: Payer: Self-pay

## 2020-08-10 VITALS — BP 138/72 | HR 83 | Temp 96.7°F | Ht 68.5 in | Wt 154.0 lb

## 2020-08-10 DIAGNOSIS — W57XXXA Bitten or stung by nonvenomous insect and other nonvenomous arthropods, initial encounter: Secondary | ICD-10-CM

## 2020-08-10 DIAGNOSIS — S0006XA Insect bite (nonvenomous) of scalp, initial encounter: Secondary | ICD-10-CM | POA: Diagnosis not present
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
## 2020-08-10 NOTE — Patient Instructions (Signed)
Tick Bite Information, Adult Ticks are insects that draw blood for food. Most ticks live in shrubs and grassy and wooded areas. They climb onto people and animals that brush against the leaves and grasses that they rest on. Then they bite, attaching themselves to the skin. Most ticks are harmless, but some ticks may carry germs that can spread to a person through a bite and cause a disease. To reduce your risk of getting a disease from a tick bite, make sure you:  Take steps to prevent tick bites.  Check for ticks after being outdoors where ticks live.  Watch for symptoms of disease if a tick attached to you or if you suspect a tick bite. How can I prevent tick bites? Take these steps to help prevent tick bites when you go outdoors in an area where ticks live: Use insect repellent  Use insect repellent that has DEET (20% or higher), picaridin, or IR3535 in it. Follow the instructions on the label. Use these products on: ? Bare skin. ? The top of your boots. ? Your pant legs. ? Your sleeve cuffs.  For insect repellent that contains permethrin, follow the instructions on the label. Use these products on: ? Clothing. ? Boots. ? Outdoor gear. ? Tents. When you are outside  Wear protective clothing. Long sleeves and long pants offer the best protection from ticks.  Wear light-colored clothing so you can see ticks more easily.  Tuck your pant legs into your socks.  If you go walking on a trail, stay in the middle of the trail so your skin, hair, and clothing do not touch the bushes.  Avoid walking through areas with long grass.  Check for ticks on your clothing, hair, and skin often while you are outside, and check again before you go inside. Make sure to check the scalp, neck, armpits, waist, groin, and joint areas. These are the spots where ticks attach themselves most often. When you go indoors  Check your clothing for ticks. Tumble dry clothes in a dryer on high heat for at least  10 minutes. If clothes are damp, additional time may be needed. If clothes require washing, use hot water.  Examine gear and pets.  Shower soon after being outdoors.  Check your body for ticks. Conduct a full body check using a mirror. What is the proper way to remove a tick? If you find a tick on your body, remove it as soon as possible. Removing a tick sooner can prevent germs from passing to your body. Do not remove the tick with your bare fingers. To remove a tick that is crawling on your skin but has not bitten, use either of these methods:  Go outdoors and brush the tick off.  Remove the tick with tape or a lint roller. To remove a tick that is attached to your skin: 1. Wash your hands. If you have latex gloves, put them on. 2. Use fine-tipped tweezers, curved forceps, or a tick-removal tool to gently grasp the tick as close to your skin and the tick's head as possible. 3. Gently pull with a steady, upward, even pressure until the tick lets go. 4. When removing the tick: ? Take care to keep the tick's head attached to its body. ? Do not twist or jerk the tick. This can make the tick's head or mouth parts break off and remain in the skin. ? Do not squeeze or crush the tick's body. This could force disease-carrying fluids from the tick   into your body. Do not try to remove a tick with heat, alcohol, petroleum jelly, or fingernail polish. Using these methods can cause the tick to salivate and regurgitate into your bloodstream, increasing your risk of getting a disease.   What should I do after removing a tick?  Dispose of the tick. Do not crush a tick with your fingers.  Clean the bite area and your hands with soap and water, rubbing alcohol, or an iodine scrub.  If an antiseptic cream or ointment is available, apply a small amount to the bite site.  Wash and disinfect any instruments that you used to remove the tick. How should I dispose of a tick? To dispose of a live tick, use  one of these methods:  Place it in rubbing alcohol.  Place it in a sealed bag or container.  Wrap it tightly in tape.  Flush it down the toilet. Contact a health care provider if:  You have symptoms of a disease after a tick bite. Symptoms of a tick-borne disease can occur from moments after the tick bites to 30 days after a tick is removed. Symptoms include: ? Fever or chills. ? Any of these signs in the bite area:  A red rash that makes a circle (bull's-eye rash) in the bite area.  Redness and swelling. ? Headache. ? Muscle, joint, or bone pain. ? Abnormal tiredness. ? Numbness in your legs or difficulty walking or moving your legs. ? Tender, swollen lymph glands.  A part of a tick breaks off and gets stuck in your skin. Get help right away if:  You are not able to remove a tick.  You experience muscle weakness or paralysis.  Your symptoms get worse or you experience new symptoms.  You find an engorged tick on your skin and you are in an area where disease from ticks is a high risk. Summary  Ticks may carry germs that can spread to a person through a bite and cause a disease.  Wear protective clothing and use insect repellent to prevent tick bites. Follow the instructions on the label.  If you find a tick on your body, remove it as soon as possible. If the tick is attached, do not try to remove with heat, alcohol, petroleum jelly, or fingernail polish.  Remove the attached tick using fine-tipped tweezers, curved forceps, or a tick-removal tool. Gently pull with steady, upward, even pressure until the tick lets go. Do not twist or jerk the tick. Do not squeeze or crush the tick's body.  If you have symptoms of a disease after being bitten by a tick, contact a health care provider. This information is not intended to replace advice given to you by your health care provider. Make sure you discuss any questions you have with your health care provider. Document Revised:  04/08/2019 Document Reviewed: 04/08/2019 Elsevier Patient Education  2021 [employer]vier Inc.  

## 2020-08-11 ENCOUNTER — Other Ambulatory Visit: Payer: Self-pay | Admitting: Oncology

## 2020-08-11 DIAGNOSIS — Z9889 Other specified postprocedural states: Secondary | ICD-10-CM

## 2020-08-19 ENCOUNTER — Ambulatory Visit (INDEPENDENT_AMBULATORY_CARE_PROVIDER_SITE_OTHER): Payer: Medicare HMO | Admitting: Family Medicine

## 2020-08-19 ENCOUNTER — Other Ambulatory Visit: Payer: Self-pay

## 2020-08-19 ENCOUNTER — Encounter: Payer: Self-pay | Admitting: Family Medicine

## 2020-08-19 VITALS — BP 137/80 | HR 78 | Temp 97.8°F | Ht 68.5 in | Wt 149.6 lb

## 2020-08-19 DIAGNOSIS — W57XXXA Bitten or stung by nonvenomous insect and other nonvenomous arthropods, initial encounter: Secondary | ICD-10-CM

## 2020-08-19 DIAGNOSIS — S30860A Insect bite (nonvenomous) of lower back and pelvis, initial encounter: Secondary | ICD-10-CM | POA: Diagnosis not present

## 2020-08-19 NOTE — Progress Notes (Signed)
Chief Complaint  Patient presents with  . Tick Removal    HPI  Patient presents today for multiple tick bites.  Most recent was removed about a week ago.  She had several about a month ago.  They have all been removed.  There were 2 and her hair that she wanted to have rechecked.  She brought in the tick and was told that it was the alpha gal type.  This time she brought the tick in later in the day after this visit and it appears to be a typical deer tick.  No specific dorsal white*was appreciated.  She denies any rash or fever.  No nausea or headache or arthralgia.  PMH: Smoking status noted ROS: Per HPI  Objective: BP 137/80   Pulse 78   Temp 97.8 F (36.6 C)   Ht 5' 8.5" (1.74 m)   Wt 149 lb 9.6 oz (67.9 kg)   LMP 04/25/1998   SpO2 100%   BMI 22.42 kg/m  Gen: NAD, alert, cooperative with exam HEENT: NCAT, EOMI, PERRL tick bite CV: RRR, good S1/S2, no murmur Resp: CTABL, no wheezes, non-labored Skin: No rash, no erythema with the exception of a erythema at the site of the tick bite at the right flank.  The 10th rib at the mid axillary line.  There is a similar pinpoint nidus with about 6 mm of surrounding faint erythema at the midline lower back approximately the level of L3. Ext: No edema, warm Neuro: Alert and oriented, No gross deficits  Assessment and plan:  1. Tick bite of lower back, initial encounter      Patient was advised that it had been too long since the tick was removed for prophylactic treatment.  Since she is asymptomatic she was reminded of the symptoms to look for and to follow-up if those occur.  She was also advised on prevention including sprayign good parts of her yard that she walks in.  She should also use the DEET to spray on her legs and pant legs when she goes outside into grassy areas or natural areas.  Follow up as needed.  Claretta Fraise, MD

## 2020-08-19 NOTE — Patient Instructions (Signed)
Tick Bite Information, Adult Ticks are insects that draw blood for food. Most ticks live in shrubs and grassy and wooded areas. They climb onto people and animals that brush against the leaves and grasses that they rest on. Then they bite, attaching themselves to the skin. Most ticks are harmless, but some ticks may carry germs that can spread to a person through a bite and cause a disease. To reduce your risk of getting a disease from a tick bite, make sure you:  Take steps to prevent tick bites.  Check for ticks after being outdoors where ticks live.  Watch for symptoms of disease if a tick attached to you or if you suspect a tick bite. How can I prevent tick bites? Take these steps to help prevent tick bites when you go outdoors in an area where ticks live: Use insect repellent  Use insect repellent that has DEET (20% or higher), picaridin, or IR3535 in it. Follow the instructions on the label. Use these products on: ? Bare skin. ? The top of your boots. ? Your pant legs. ? Your sleeve cuffs.  For insect repellent that contains permethrin, follow the instructions on the label. Use these products on: ? Clothing. ? Boots. ? Outdoor gear. ? Tents. When you are outside  Wear protective clothing. Long sleeves and long pants offer the best protection from ticks.  Wear light-colored clothing so you can see ticks more easily.  Tuck your pant legs into your socks.  If you go walking on a trail, stay in the middle of the trail so your skin, hair, and clothing do not touch the bushes.  Avoid walking through areas with long grass.  Check for ticks on your clothing, hair, and skin often while you are outside, and check again before you go inside. Make sure to check the scalp, neck, armpits, waist, groin, and joint areas. These are the spots where ticks attach themselves most often. When you go indoors  Check your clothing for ticks. Tumble dry clothes in a dryer on high heat for at least  10 minutes. If clothes are damp, additional time may be needed. If clothes require washing, use hot water.  Examine gear and pets.  Shower soon after being outdoors.  Check your body for ticks. Conduct a full body check using a mirror. What is the proper way to remove a tick? If you find a tick on your body, remove it as soon as possible. Removing a tick sooner can prevent germs from passing to your body. Do not remove the tick with your bare fingers. To remove a tick that is crawling on your skin but has not bitten, use either of these methods:  Go outdoors and brush the tick off.  Remove the tick with tape or a lint roller. To remove a tick that is attached to your skin: 1. Wash your hands. If you have latex gloves, put them on. 2. Use fine-tipped tweezers, curved forceps, or a tick-removal tool to gently grasp the tick as close to your skin and the tick's head as possible. 3. Gently pull with a steady, upward, even pressure until the tick lets go. 4. When removing the tick: ? Take care to keep the tick's head attached to its body. ? Do not twist or jerk the tick. This can make the tick's head or mouth parts break off and remain in the skin. ? Do not squeeze or crush the tick's body. This could force disease-carrying fluids from the tick   into your body. Do not try to remove a tick with heat, alcohol, petroleum jelly, or fingernail polish. Using these methods can cause the tick to salivate and regurgitate into your bloodstream, increasing your risk of getting a disease.   What should I do after removing a tick?  Dispose of the tick. Do not crush a tick with your fingers.  Clean the bite area and your hands with soap and water, rubbing alcohol, or an iodine scrub.  If an antiseptic cream or ointment is available, apply a small amount to the bite site.  Wash and disinfect any instruments that you used to remove the tick. How should I dispose of a tick? To dispose of a live tick, use  one of these methods:  Place it in rubbing alcohol.  Place it in a sealed bag or container.  Wrap it tightly in tape.  Flush it down the toilet. Contact a health care provider if:  You have symptoms of a disease after a tick bite. Symptoms of a tick-borne disease can occur from moments after the tick bites to 30 days after a tick is removed. Symptoms include: ? Fever or chills. ? Any of these signs in the bite area:  A red rash that makes a circle (bull's-eye rash) in the bite area.  Redness and swelling. ? Headache. ? Muscle, joint, or bone pain. ? Abnormal tiredness. ? Numbness in your legs or difficulty walking or moving your legs. ? Tender, swollen lymph glands.  A part of a tick breaks off and gets stuck in your skin. Get help right away if:  You are not able to remove a tick.  You experience muscle weakness or paralysis.  Your symptoms get worse or you experience new symptoms.  You find an engorged tick on your skin and you are in an area where disease from ticks is a high risk. Summary  Ticks may carry germs that can spread to a person through a bite and cause a disease.  Wear protective clothing and use insect repellent to prevent tick bites. Follow the instructions on the label.  If you find a tick on your body, remove it as soon as possible. If the tick is attached, do not try to remove with heat, alcohol, petroleum jelly, or fingernail polish.  Remove the attached tick using fine-tipped tweezers, curved forceps, or a tick-removal tool. Gently pull with steady, upward, even pressure until the tick lets go. Do not twist or jerk the tick. Do not squeeze or crush the tick's body.  If you have symptoms of a disease after being bitten by a tick, contact a health care provider. This information is not intended to replace advice given to you by your health care provider. Make sure you discuss any questions you have with your health care provider. Document Revised:  04/08/2019 Document Reviewed: 04/08/2019 Elsevier Patient Education  2021 [employer]vier Inc.  

## 2020-08-25 DIAGNOSIS — D225 Melanocytic nevi of trunk: Secondary | ICD-10-CM | POA: Diagnosis not present

## 2020-08-25 DIAGNOSIS — L650 Telogen effluvium: Secondary | ICD-10-CM | POA: Diagnosis not present

## 2020-08-25 DIAGNOSIS — L821 Other seborrheic keratosis: Secondary | ICD-10-CM | POA: Diagnosis not present

## 2020-08-25 DIAGNOSIS — D2262 Melanocytic nevi of left upper limb, including shoulder: Secondary | ICD-10-CM | POA: Diagnosis not present

## 2020-08-25 DIAGNOSIS — D1801 Hemangioma of skin and subcutaneous tissue: Secondary | ICD-10-CM | POA: Diagnosis not present

## 2020-08-25 DIAGNOSIS — Z85828 Personal history of other malignant neoplasm of skin: Secondary | ICD-10-CM | POA: Diagnosis not present

## 2020-08-25 DIAGNOSIS — D2271 Melanocytic nevi of right lower limb, including hip: Secondary | ICD-10-CM | POA: Diagnosis not present

## 2020-08-25 DIAGNOSIS — D2261 Melanocytic nevi of right upper limb, including shoulder: Secondary | ICD-10-CM | POA: Diagnosis not present

## 2020-08-25 DIAGNOSIS — D2272 Melanocytic nevi of left lower limb, including hip: Secondary | ICD-10-CM | POA: Diagnosis not present

## 2020-08-27 ENCOUNTER — Other Ambulatory Visit: Payer: Self-pay

## 2020-08-27 ENCOUNTER — Encounter (HOSPITAL_BASED_OUTPATIENT_CLINIC_OR_DEPARTMENT_OTHER): Payer: Self-pay | Admitting: Obstetrics & Gynecology

## 2020-08-27 ENCOUNTER — Telehealth (HOSPITAL_BASED_OUTPATIENT_CLINIC_OR_DEPARTMENT_OTHER): Payer: Self-pay | Admitting: *Deleted

## 2020-08-27 ENCOUNTER — Ambulatory Visit (INDEPENDENT_AMBULATORY_CARE_PROVIDER_SITE_OTHER): Payer: Medicare HMO | Admitting: Obstetrics & Gynecology

## 2020-08-27 VITALS — BP 122/61 | HR 75 | Ht 68.5 in | Wt 150.0 lb

## 2020-08-27 DIAGNOSIS — N952 Postmenopausal atrophic vaginitis: Secondary | ICD-10-CM | POA: Diagnosis not present

## 2020-08-27 DIAGNOSIS — L98499 Non-pressure chronic ulcer of skin of other sites with unspecified severity: Secondary | ICD-10-CM | POA: Diagnosis not present
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
## 2020-08-27 NOTE — Telephone Encounter (Signed)
Pt called stating that she had gotten a tick bite in her groin and would like for Dr. Sabra Heck to assess it. She states it has gotten a little red around the bite. Advised that tick bites are more of a primary care provider visit. Pt states that she would rather see Dr. Sabra Heck. Advised that there were no available appointments for today or tomorrow. Pt asked that we give her a call if there were any cancellations. Advised that we would do that but if the area were to start looking worse she would need to seek medical attention from her PCP or urgent care. Pt verbalized understanding.

## 2020-09-17 ENCOUNTER — Other Ambulatory Visit: Payer: Self-pay | Admitting: Oncology

## 2020-09-17 DIAGNOSIS — Z853 Personal history of malignant neoplasm of breast: Secondary | ICD-10-CM

## 2020-09-24 ENCOUNTER — Other Ambulatory Visit: Payer: Self-pay

## 2020-09-24 ENCOUNTER — Ambulatory Visit
Admission: RE | Admit: 2020-09-24 | Discharge: 2020-09-24 | Disposition: A | Discharge status: Home / Self Care | Payer: Medicare HMO | Source: Ambulatory Visit | Attending: Oncology | Admitting: Oncology

## 2020-09-24 DIAGNOSIS — R922 Inconclusive mammogram: Secondary | ICD-10-CM | POA: Diagnosis not present

## 2020-09-24 DIAGNOSIS — Z853 Personal history of malignant neoplasm of breast: Secondary | ICD-10-CM

## 2020-10-01 ENCOUNTER — Ambulatory Visit: Payer: Medicare HMO

## 2020-10-03 ENCOUNTER — Encounter: Payer: Self-pay | Admitting: Oncology

## 2020-10-27 IMAGING — MG DIGITAL DIAGNOSTIC BILATERAL MAMMOGRAM WITH TOMO AND CAD
9 series · 9 of 25 positions shown · non-contrast
Comparison: Previous exam(s).

CLINICAL DATA: Status post right lumpectomy for breast carcinoma,
performed on 09/10/2017, with adjuvant radiation therapy.No current
breast complaints.

EXAM:
DIGITAL DIAGNOSTIC BILATERAL MAMMOGRAM WITH CAD AND TOMO

[R CC]
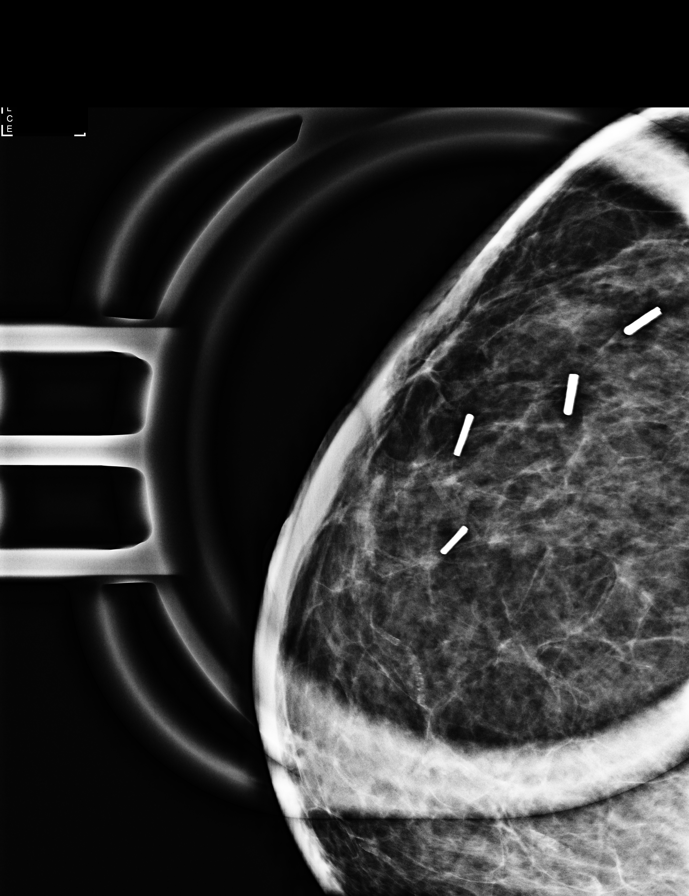

[L MLO synth-2D]
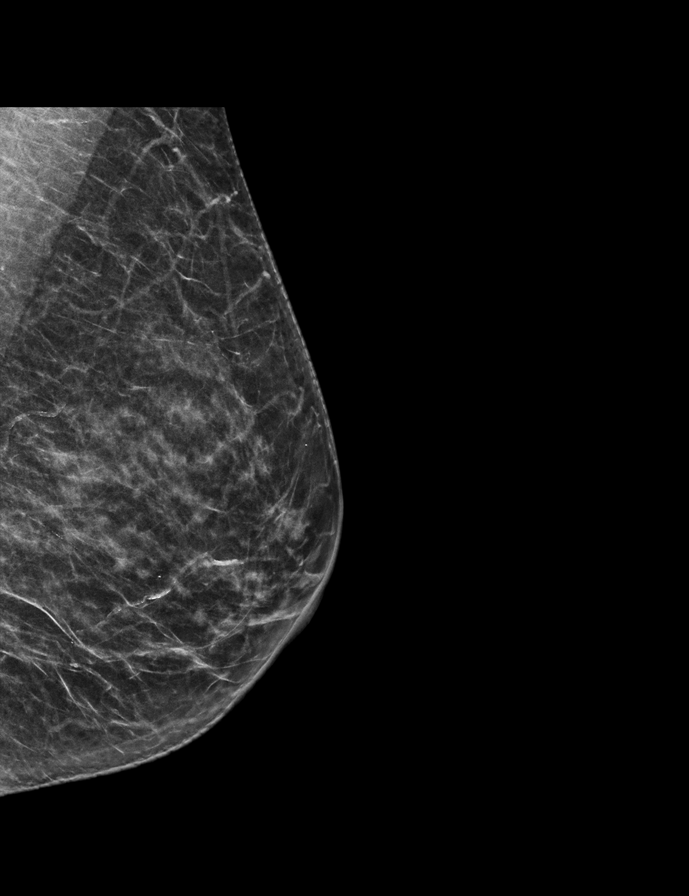

[R MLO synth-2D]
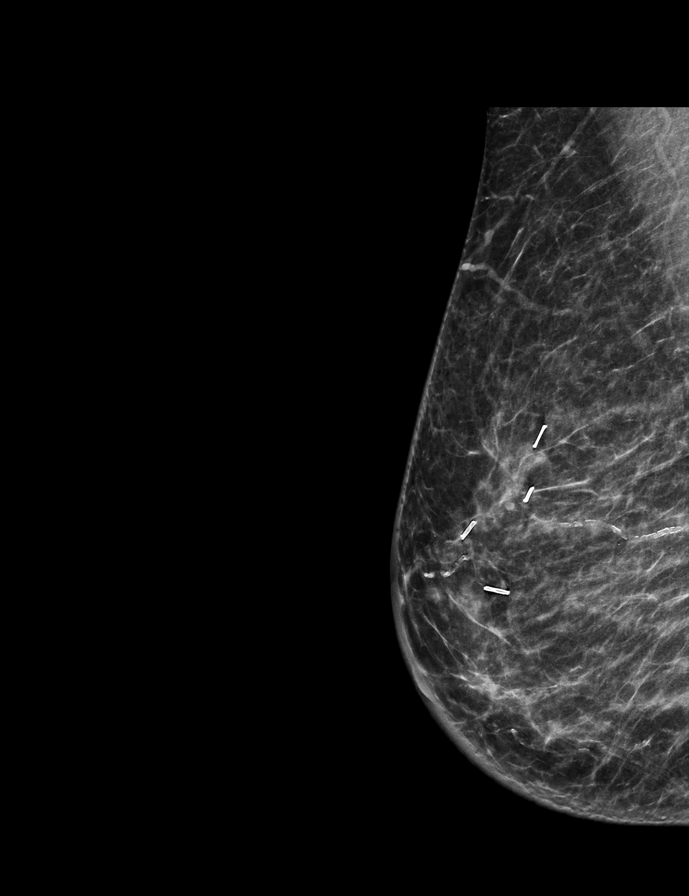

[L CC synth-2D]
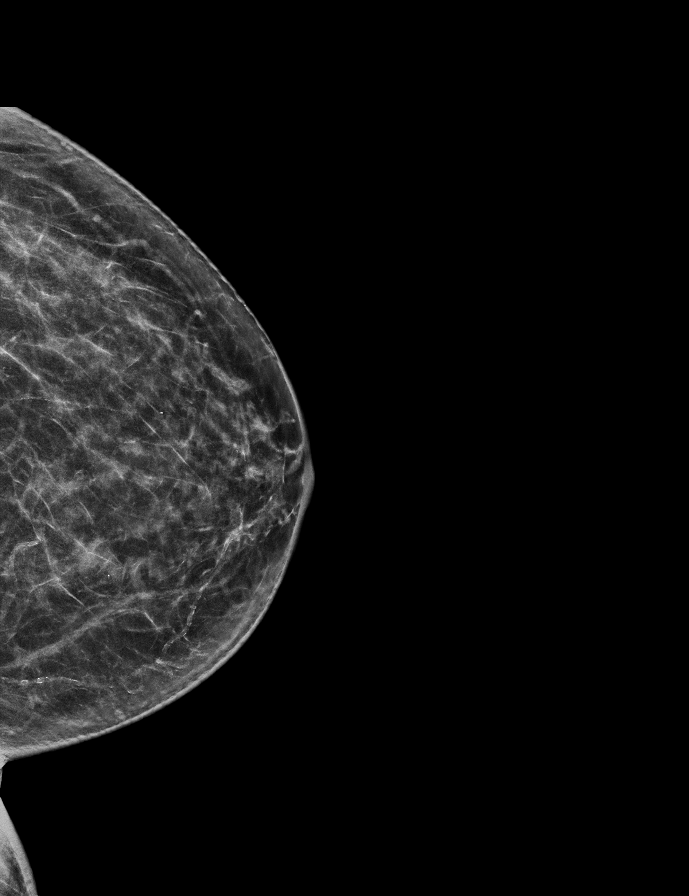

[R CC synth-2D]
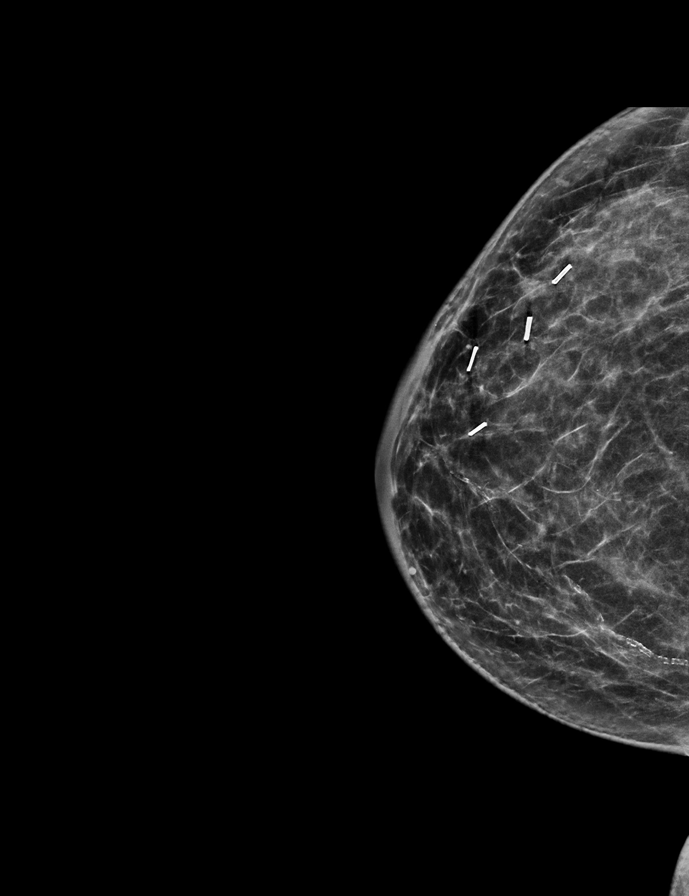

[L MLO tomo · tomo slice 28/55.0]
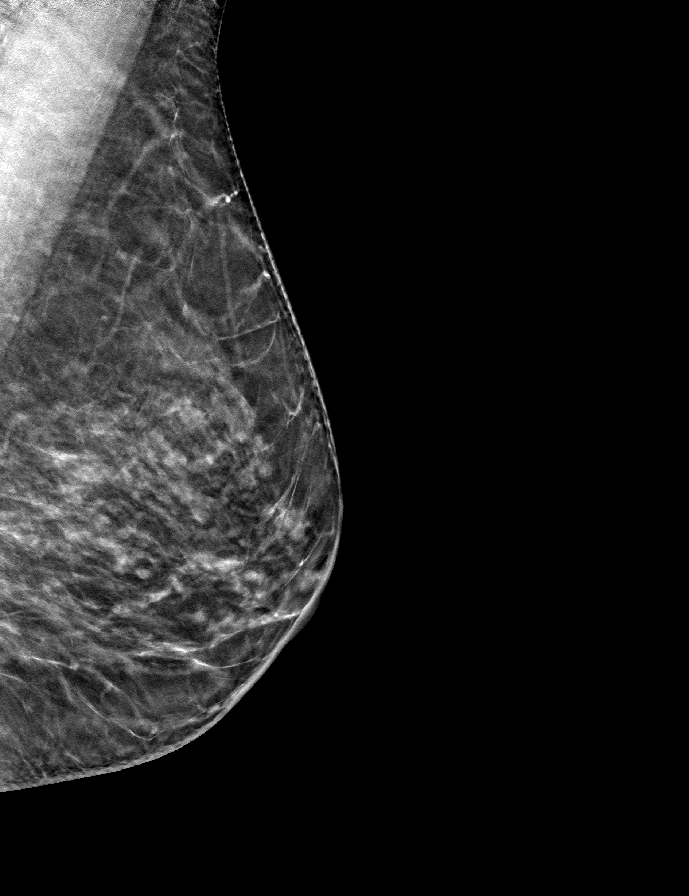

[R MLO tomo · tomo slice 28/55.0]
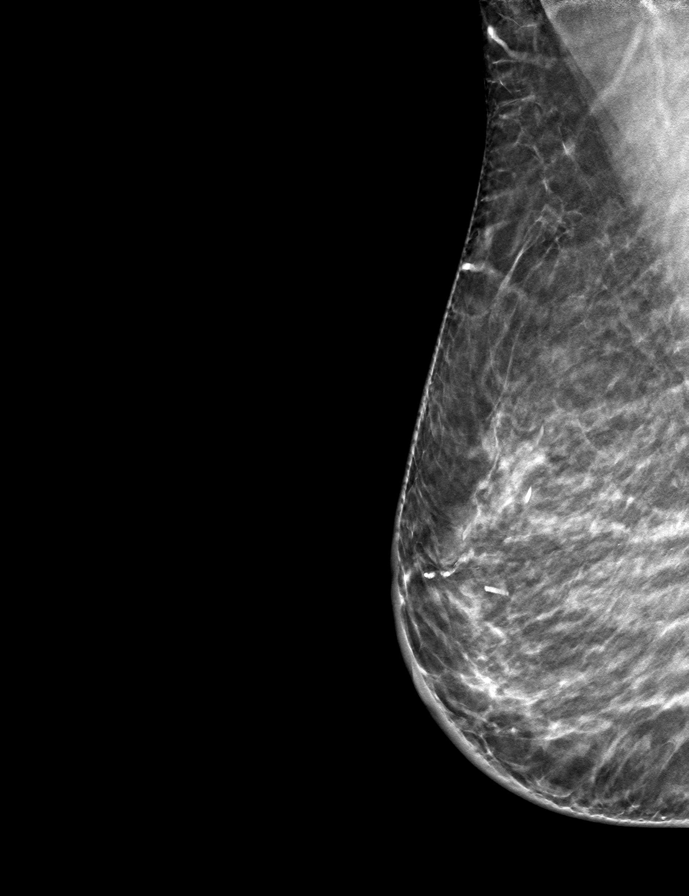

[L CC tomo · tomo slice 29/58.0]
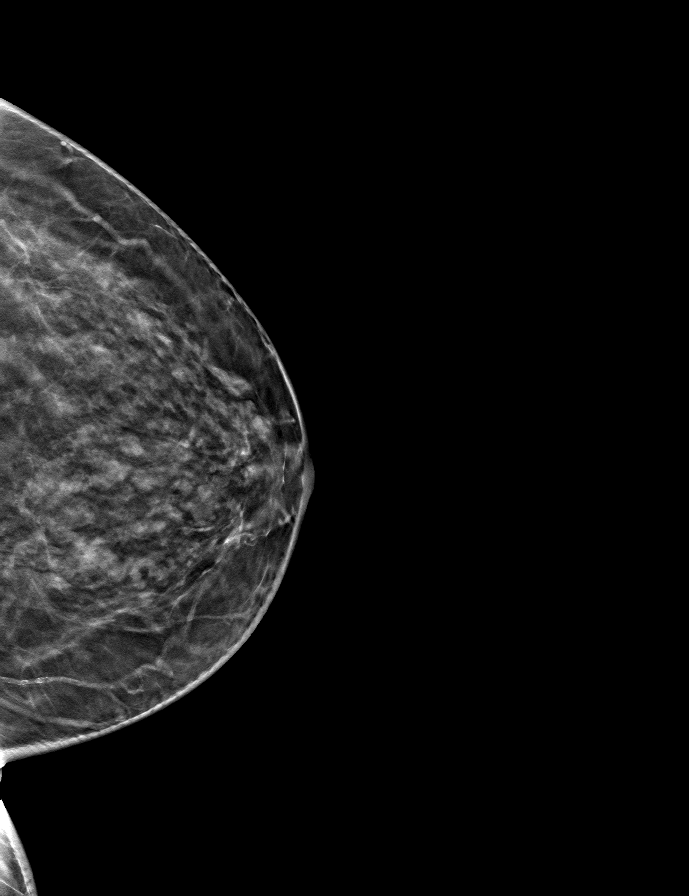

[R CC tomo · tomo slice 33/64.0]
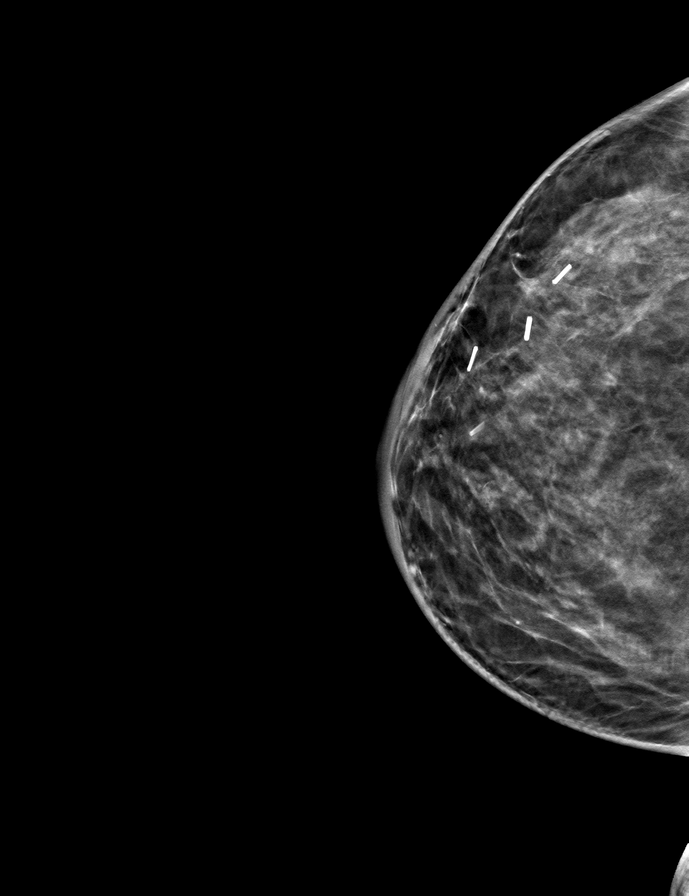

[9 of 25 positions shown; findings below may reference images not displayed]

ACR Breast Density Category b: There are scattered areas of
fibroglandular density.
FINDINGS: Mild postsurgical scarring is noted in the upper outer quadrant of
the right breast with associated surgical vascular clips.

There are no suspicious masses, no areas of nonsurgical
architectural distortion and no suspicious or new calcifications.

Mammographic images were processed with CAD.
IMPRESSION: 1. No evidence of new or recurrent breast carcinoma.
2. Benign postsurgical changes on the right.

RECOMMENDATION:
Diagnostic mammography in 1 year per standard post lumpectomy
protocol.

I have discussed the findings and recommendations with the patient.
Results were also provided in writing at the conclusion of the
visit. If applicable, a reminder letter will be sent to the patient
regarding the next appointment.

BI-RADS CATEGORY  2: Benign.

## 2020-11-04 DIAGNOSIS — Z85828 Personal history of other malignant neoplasm of skin: Secondary | ICD-10-CM | POA: Diagnosis not present

## 2020-11-04 DIAGNOSIS — L738 Other specified follicular disorders: Secondary | ICD-10-CM | POA: Diagnosis not present

## 2020-11-04 DIAGNOSIS — L218 Other seborrheic dermatitis: Secondary | ICD-10-CM | POA: Diagnosis not present

## 2020-11-12 ENCOUNTER — Encounter: Payer: Self-pay | Admitting: Family Medicine

## 2020-11-12 ENCOUNTER — Ambulatory Visit (INDEPENDENT_AMBULATORY_CARE_PROVIDER_SITE_OTHER): Payer: Medicare HMO | Admitting: Family Medicine

## 2020-11-12 DIAGNOSIS — J069 Acute upper respiratory infection, unspecified: Secondary | ICD-10-CM

## 2020-11-12 MED ORDER — GUAIFENESIN ER 600 MG PO TB12: 1200.0000 mg | ORAL_TABLET | Freq: Two times a day (BID) | ORAL | 0 refills | Status: DC | PRN

## 2020-11-12 MED ORDER — FLUTICASONE PROPIONATE 50 MCG/ACT NA SUSP: 2.0000 | Freq: Every day | NASAL | 6 refills | Status: DC

## 2020-11-12 NOTE — Progress Notes (Signed)
   Virtual Visit  Note Due to COVID-19 pandemic this visit was conducted virtually. This visit type was conducted due to national recommendations for restrictions regarding the COVID-19 Pandemic (e.g. social distancing, sheltering in place) in an effort to limit this patient's exposure and mitigate transmission in our community. All issues noted in this document were discussed and addressed.  A physical exam was not performed with this format.  I connected with Tiffany Dillon on 11/12/20 at 1450 by telephone and verified that I am speaking with the correct person using two identifiers. Tiffany Dillon is currently located at home and no one is currently with her during the visit. The provider, Gwenlyn Perking, FNP is located in their office at time of visit.  I discussed the limitations, risks, security and privacy concerns of performing an evaluation and management service by telephone and the availability of in person appointments. I also discussed with the patient that there may be a patient responsible charge related to this service. The patient expressed understanding and agreed to proceed.  CC: congestion  History and Present Illness:  HPI Tiffany Dillon reports a HA 5 days ago that lasted for 1 day. Then she felt fine the next day. The follow day she had diarrhea. Two days ago she started having congestion, runny nose, mild cough, and had a fever up to 101. She continues to have congestion, mild cough, and runny nose. Fever has resolved. Denies sore throat, shortness of breath, chest pain, nausea, or vomiting. She had a negative home Covid test. She has taken advil for her symptoms. She reports having Covid in Dec. Of 2020. She has also been vaccinated and had a booster.     ROS As per HPI.   Observations/Objective: Alert and oriented x 3. Able to speak in full sentences without difficulty.   Assessment and Plan: Diagnoses and all orders for this visit:  URI,  acute Declined PCR Covid test today. Mucinex and flonase ordered. Tylenol or advil for pain or fever. Rest, stay well hydrated. Return to office for new or worsening symptoms, or if symptoms persist.  -     guaiFENesin (MUCINEX) 600 MG 12 hr tablet; Take 2 tablets (1,200 mg total) by mouth 2 (two) times daily as needed for cough or to loosen phlegm. -     fluticasone (FLONASE) 50 MCG/ACT nasal spray; Place 2 sprays into both nostrils daily.    Follow Up Instructions: As needed.     I discussed the assessment and treatment plan with the patient. The patient was provided an opportunity to ask questions and all were answered. The patient agreed with the plan and demonstrated an understanding of the instructions.   The patient was advised to call back or seek an in-person evaluation if the symptoms worsen or if the condition fails to improve as anticipated.  The above assessment and management plan was discussed with the patient. The patient verbalized understanding of and has agreed to the management plan. Patient is aware to call the clinic if symptoms persist or worsen. Patient is aware when to return to the clinic for a follow-up visit. Patient educated on when it is appropriate to go to the emergency department.   Time call ended:  1303  I provided 13 minutes of  non face-to-face time during this encounter.    Gwenlyn Perking, FNP

## 2021-03-01 ENCOUNTER — Telehealth: Payer: Self-pay | Admitting: Family Medicine

## 2021-03-01 NOTE — Telephone Encounter (Signed)
I spoke to pt and answered questions regarding the flu shots we have available here.

## 2021-03-08 ENCOUNTER — Other Ambulatory Visit: Payer: Self-pay

## 2021-03-08 ENCOUNTER — Ambulatory Visit (INDEPENDENT_AMBULATORY_CARE_PROVIDER_SITE_OTHER): Payer: Medicare HMO

## 2021-03-08 DIAGNOSIS — Z23 Encounter for immunization: Secondary | ICD-10-CM | POA: Diagnosis not present

## 2021-07-02 ENCOUNTER — Ambulatory Visit (INDEPENDENT_AMBULATORY_CARE_PROVIDER_SITE_OTHER): Payer: Medicare HMO | Admitting: Obstetrics & Gynecology

## 2021-07-02 ENCOUNTER — Other Ambulatory Visit: Payer: Self-pay

## 2021-07-02 ENCOUNTER — Encounter (HOSPITAL_BASED_OUTPATIENT_CLINIC_OR_DEPARTMENT_OTHER): Payer: Self-pay | Admitting: Obstetrics & Gynecology

## 2021-07-02 VITALS — BP 131/71 | HR 73 | Ht 68.5 in | Wt 154.0 lb

## 2021-07-02 DIAGNOSIS — Z862 Personal history of diseases of the blood and blood-forming organs and certain disorders involving the immune mechanism: Secondary | ICD-10-CM | POA: Diagnosis not present

## 2021-07-02 DIAGNOSIS — E7800 Pure hypercholesterolemia, unspecified: Secondary | ICD-10-CM | POA: Diagnosis not present

## 2021-07-02 DIAGNOSIS — E673 Hypervitaminosis D: Secondary | ICD-10-CM | POA: Diagnosis not present

## 2021-07-02 DIAGNOSIS — D0511 Intraductal carcinoma in situ of right breast: Secondary | ICD-10-CM

## 2021-07-02 DIAGNOSIS — Z9189 Other specified personal risk factors, not elsewhere classified: Secondary | ICD-10-CM | POA: Diagnosis not present

## 2021-07-02 DIAGNOSIS — N952 Postmenopausal atrophic vaginitis: Secondary | ICD-10-CM | POA: Diagnosis not present
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
## 2021-07-03 LAB — LIPID PANEL
Chol/HDL Ratio: 2.6 ratio (ref 0.0–4.4)
Cholesterol, Total: 203 mg/dL — ABNORMAL HIGH (ref 100–199)
HDL: 77 mg/dL (ref >39)
LDL Chol Calc (NIH): 114 mg/dL — ABNORMAL HIGH (ref 0–99)
Triglycerides: 69 mg/dL (ref 0–149)
VLDL Cholesterol Cal: 12 mg/dL (ref 5–40)

## 2021-07-03 LAB — COMPREHENSIVE METABOLIC PANEL
ALT: 20 IU/L (ref 0–32)
AST: 22 IU/L (ref 0–40)
Albumin/Globulin Ratio: 1.9 (ref 1.2–2.2)
Albumin: 4.3 g/dL (ref 3.7–4.7)
Alkaline Phosphatase: 74 IU/L (ref 44–121)
BUN/Creatinine Ratio: 18 (ref 12–28)
BUN: 16 mg/dL (ref 8–27)
Bilirubin Total: 0.5 mg/dL (ref 0.0–1.2)
CO2: 21 mmol/L (ref 20–29)
Calcium: 9.2 mg/dL (ref 8.7–10.3)
Chloride: 98 mmol/L (ref 96–106)
Creatinine, Ser: 0.89 mg/dL (ref 0.57–1.00)
Globulin, Total: 2.3 g/dL (ref 1.5–4.5)
Glucose: 85 mg/dL (ref 70–99)
Potassium: 4.7 mmol/L (ref 3.5–5.2)
Sodium: 138 mmol/L (ref 134–144)
Total Protein: 6.6 g/dL (ref 6.0–8.5)
eGFR: 69 mL/min/{1.73_m2} (ref >59)

## 2021-07-03 LAB — CBC
Hematocrit: 43.8 % (ref 34.0–46.6)
Hemoglobin: 14.9 g/dL (ref 11.1–15.9)
MCH: 32.5 pg (ref 26.6–33.0)
MCHC: 34 g/dL (ref 31.5–35.7)
MCV: 95 fL (ref 79–97)
Platelets: 212 10*3/uL (ref 150–450)
RBC: 4.59 x10E6/uL (ref 3.77–5.28)
RDW: 11.6 % — ABNORMAL LOW (ref 11.7–15.4)
WBC: 5.8 10*3/uL (ref 3.4–10.8)

## 2021-07-03 LAB — HEMOGLOBIN A1C
Est. average glucose Bld gHb Est-mCnc: 105 mg/dL
Hgb A1c MFr Bld: 5.3 % (ref 4.8–5.6)

## 2021-07-03 LAB — VITAMIN D 25 HYDROXY (VIT D DEFICIENCY, FRACTURES): Vit D, 25-Hydroxy: 59.7 ng/mL (ref 30.0–100.0)

## 2021-07-07 ENCOUNTER — Encounter (HOSPITAL_BASED_OUTPATIENT_CLINIC_OR_DEPARTMENT_OTHER): Payer: Self-pay | Admitting: Obstetrics & Gynecology

## 2021-07-15 ENCOUNTER — Encounter (HOSPITAL_BASED_OUTPATIENT_CLINIC_OR_DEPARTMENT_OTHER): Payer: Self-pay | Admitting: *Deleted

## 2021-07-20 DIAGNOSIS — H5213 Myopia, bilateral: Secondary | ICD-10-CM | POA: Diagnosis not present

## 2021-07-20 DIAGNOSIS — H43813 Vitreous degeneration, bilateral: Secondary | ICD-10-CM | POA: Diagnosis not present

## 2021-07-20 DIAGNOSIS — H25013 Cortical age-related cataract, bilateral: Secondary | ICD-10-CM | POA: Diagnosis not present

## 2021-07-20 DIAGNOSIS — H35373 Puckering of macula, bilateral: Secondary | ICD-10-CM | POA: Diagnosis not present

## 2021-07-30 LAB — SPECIMEN STATUS REPORT

## 2021-07-30 LAB — TSH: TSH: 4.13 u[IU]/mL (ref 0.450–4.500)

## 2021-08-11 ENCOUNTER — Other Ambulatory Visit: Payer: Self-pay | Admitting: Obstetrics & Gynecology

## 2021-08-11 DIAGNOSIS — Z853 Personal history of malignant neoplasm of breast: Secondary | ICD-10-CM

## 2021-08-13 ENCOUNTER — Encounter: Payer: Self-pay | Admitting: Family Medicine

## 2021-08-13 ENCOUNTER — Ambulatory Visit (INDEPENDENT_AMBULATORY_CARE_PROVIDER_SITE_OTHER): Payer: Medicare HMO | Admitting: Family Medicine

## 2021-08-13 VITALS — BP 118/58 | HR 77 | Temp 98.0°F | Ht 68.5 in | Wt 150.0 lb

## 2021-08-13 DIAGNOSIS — I83813 Varicose veins of bilateral lower extremities with pain: Secondary | ICD-10-CM | POA: Diagnosis not present

## 2021-08-13 DIAGNOSIS — Z170 Estrogen receptor positive status [ER+]: Secondary | ICD-10-CM | POA: Diagnosis not present

## 2021-08-13 DIAGNOSIS — C50411 Malignant neoplasm of upper-outer quadrant of right female breast: Secondary | ICD-10-CM
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
## 2021-08-18 ENCOUNTER — Telehealth: Payer: Self-pay | Admitting: Family Medicine

## 2021-08-18 NOTE — Telephone Encounter (Signed)
Patient calling because she was told to get a scan of her varicose veins but she was not told when to go. Order is in but there is no appt. Where is patient supposed to go and when? Please call back.  ?

## 2021-08-20 NOTE — Telephone Encounter (Signed)
Patient informed. 

## 2021-08-26 ENCOUNTER — Telehealth: Payer: Self-pay | Admitting: Family Medicine

## 2021-08-26 NOTE — Telephone Encounter (Signed)
That's ok. If she has increased swelling in leg, redness, warmth, or SOB go to ED.  ?

## 2021-08-26 NOTE — Telephone Encounter (Signed)
Patient informed. 

## 2021-09-03 ENCOUNTER — Ambulatory Visit (INDEPENDENT_AMBULATORY_CARE_PROVIDER_SITE_OTHER): Payer: Medicare HMO

## 2021-09-03 DIAGNOSIS — I83813 Varicose veins of bilateral lower extremities with pain: Secondary | ICD-10-CM | POA: Diagnosis not present

## 2021-09-15 DIAGNOSIS — L723 Sebaceous cyst: Secondary | ICD-10-CM | POA: Diagnosis not present

## 2021-09-15 DIAGNOSIS — D1801 Hemangioma of skin and subcutaneous tissue: Secondary | ICD-10-CM | POA: Diagnosis not present

## 2021-09-15 DIAGNOSIS — D225 Melanocytic nevi of trunk: Secondary | ICD-10-CM | POA: Diagnosis not present

## 2021-09-15 DIAGNOSIS — Z85828 Personal history of other malignant neoplasm of skin: Secondary | ICD-10-CM | POA: Diagnosis not present

## 2021-09-15 DIAGNOSIS — D224 Melanocytic nevi of scalp and neck: Secondary | ICD-10-CM | POA: Diagnosis not present

## 2021-09-15 DIAGNOSIS — D2272 Melanocytic nevi of left lower limb, including hip: Secondary | ICD-10-CM | POA: Diagnosis not present

## 2021-09-15 DIAGNOSIS — L821 Other seborrheic keratosis: Secondary | ICD-10-CM | POA: Diagnosis not present

## 2021-09-15 DIAGNOSIS — L218 Other seborrheic dermatitis: Secondary | ICD-10-CM | POA: Diagnosis not present

## 2021-09-27 ENCOUNTER — Ambulatory Visit
Admission: RE | Admit: 2021-09-27 | Discharge: 2021-09-27 | Disposition: A | Discharge status: Home / Self Care | Payer: Medicare HMO | Source: Ambulatory Visit | Attending: Obstetrics & Gynecology | Admitting: Obstetrics & Gynecology

## 2021-09-27 DIAGNOSIS — Z853 Personal history of malignant neoplasm of breast: Secondary | ICD-10-CM

## 2021-09-30 ENCOUNTER — Ambulatory Visit (INDEPENDENT_AMBULATORY_CARE_PROVIDER_SITE_OTHER): Payer: Medicare HMO | Admitting: Family Medicine

## 2021-09-30 ENCOUNTER — Encounter: Payer: Self-pay | Admitting: Family Medicine

## 2021-09-30 VITALS — BP 122/75 | HR 76 | Temp 98.7°F | Ht 68.5 in | Wt 152.0 lb

## 2021-09-30 DIAGNOSIS — H9202 Otalgia, left ear: Secondary | ICD-10-CM | POA: Diagnosis not present
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
## 2022-02-02 DIAGNOSIS — H61002 Unspecified perichondritis of left external ear: Secondary | ICD-10-CM | POA: Diagnosis not present

## 2022-02-02 DIAGNOSIS — Z85828 Personal history of other malignant neoplasm of skin: Secondary | ICD-10-CM | POA: Diagnosis not present

## 2022-02-02 DIAGNOSIS — L821 Other seborrheic keratosis: Secondary | ICD-10-CM | POA: Diagnosis not present

## 2022-02-02 DIAGNOSIS — Z419 Encounter for procedure for purposes other than remedying health state, unspecified: Secondary | ICD-10-CM | POA: Diagnosis not present

## 2022-02-25 ENCOUNTER — Encounter: Payer: Self-pay | Admitting: Family Medicine

## 2022-02-25 ENCOUNTER — Ambulatory Visit (INDEPENDENT_AMBULATORY_CARE_PROVIDER_SITE_OTHER): Payer: Medicare HMO | Admitting: Family Medicine

## 2022-02-25 DIAGNOSIS — J069 Acute upper respiratory infection, unspecified: Secondary | ICD-10-CM | POA: Diagnosis not present

## 2022-02-25 MED ORDER — GUAIFENESIN ER 600 MG PO TB12: 600.0000 mg | ORAL_TABLET | Freq: Two times a day (BID) | ORAL | 0 refills | Status: AC

## 2022-02-25 MED ORDER — GUAIFENESIN ER 600 MG PO TB12: 600.0000 mg | ORAL_TABLET | Freq: Two times a day (BID) | ORAL | 0 refills | Status: DC
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
## 2022-07-07 ENCOUNTER — Ambulatory Visit (INDEPENDENT_AMBULATORY_CARE_PROVIDER_SITE_OTHER): Payer: Medicare HMO | Admitting: Obstetrics & Gynecology

## 2022-07-07 ENCOUNTER — Encounter (HOSPITAL_BASED_OUTPATIENT_CLINIC_OR_DEPARTMENT_OTHER): Payer: Self-pay | Admitting: Obstetrics & Gynecology

## 2022-07-07 VITALS — BP 110/65 | HR 75 | Ht 68.0 in | Wt 153.6 lb

## 2022-07-07 DIAGNOSIS — Z803 Family history of malignant neoplasm of breast: Secondary | ICD-10-CM | POA: Diagnosis not present

## 2022-07-07 DIAGNOSIS — D0511 Intraductal carcinoma in situ of right breast: Secondary | ICD-10-CM

## 2022-07-07 DIAGNOSIS — Z859 Personal history of malignant neoplasm, unspecified: Secondary | ICD-10-CM

## 2022-07-07 DIAGNOSIS — Z9189 Other specified personal risk factors, not elsewhere classified: Secondary | ICD-10-CM

## 2022-07-07 DIAGNOSIS — E7800 Pure hypercholesterolemia, unspecified: Secondary | ICD-10-CM | POA: Diagnosis not present

## 2022-07-07 DIAGNOSIS — N952 Postmenopausal atrophic vaginitis: Secondary | ICD-10-CM
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
## 2022-07-08 LAB — COMPREHENSIVE METABOLIC PANEL
ALT: 16 IU/L (ref 0–32)
AST: 21 IU/L (ref 0–40)
Albumin/Globulin Ratio: 2.1 (ref 1.2–2.2)
Albumin: 4.2 g/dL (ref 3.8–4.8)
Alkaline Phosphatase: 78 IU/L (ref 44–121)
BUN/Creatinine Ratio: 21 (ref 12–28)
BUN: 18 mg/dL (ref 8–27)
Bilirubin Total: 0.4 mg/dL (ref 0.0–1.2)
CO2: 18 mmol/L — ABNORMAL LOW (ref 20–29)
Calcium: 8.9 mg/dL (ref 8.7–10.3)
Chloride: 103 mmol/L (ref 96–106)
Creatinine, Ser: 0.87 mg/dL (ref 0.57–1.00)
Globulin, Total: 2 g/dL (ref 1.5–4.5)
Glucose: 91 mg/dL (ref 70–99)
Potassium: 4.4 mmol/L (ref 3.5–5.2)
Sodium: 137 mmol/L (ref 134–144)
Total Protein: 6.2 g/dL (ref 6.0–8.5)
eGFR: 70 mL/min/{1.73_m2} (ref >59)

## 2022-07-08 LAB — LIPID PANEL
Chol/HDL Ratio: 2.9 ratio (ref 0.0–4.4)
Cholesterol, Total: 177 mg/dL (ref 100–199)
HDL: 62 mg/dL (ref >39)
LDL Chol Calc (NIH): 103 mg/dL — ABNORMAL HIGH (ref 0–99)
Triglycerides: 63 mg/dL (ref 0–149)
VLDL Cholesterol Cal: 12 mg/dL (ref 5–40)

## 2022-07-08 LAB — CBC
Hematocrit: 41.3 % (ref 34.0–46.6)
Hemoglobin: 14.2 g/dL (ref 11.1–15.9)
MCH: 32.2 pg (ref 26.6–33.0)
MCHC: 34.4 g/dL (ref 31.5–35.7)
MCV: 94 fL (ref 79–97)
Platelets: 207 10*3/uL (ref 150–450)
RBC: 4.41 x10E6/uL (ref 3.77–5.28)
RDW: 11.8 % (ref 11.7–15.4)
WBC: 5 10*3/uL (ref 3.4–10.8)

## 2022-07-08 LAB — HEMOGLOBIN A1C
Est. average glucose Bld gHb Est-mCnc: 117 mg/dL
Hgb A1c MFr Bld: 5.7 % — ABNORMAL HIGH (ref 4.8–5.6)

## 2022-07-15 ENCOUNTER — Ambulatory Visit: Payer: Medicare HMO | Admitting: Family Medicine

## 2022-07-26 DIAGNOSIS — H524 Presbyopia: Secondary | ICD-10-CM | POA: Diagnosis not present

## 2022-07-26 DIAGNOSIS — H25013 Cortical age-related cataract, bilateral: Secondary | ICD-10-CM | POA: Diagnosis not present

## 2022-07-26 DIAGNOSIS — H5213 Myopia, bilateral: Secondary | ICD-10-CM | POA: Diagnosis not present

## 2022-07-26 DIAGNOSIS — H2513 Age-related nuclear cataract, bilateral: Secondary | ICD-10-CM | POA: Diagnosis not present

## 2022-07-26 DIAGNOSIS — H04123 Dry eye syndrome of bilateral lacrimal glands: Secondary | ICD-10-CM | POA: Diagnosis not present

## 2022-07-26 DIAGNOSIS — H35373 Puckering of macula, bilateral: Secondary | ICD-10-CM | POA: Diagnosis not present

## 2022-07-26 DIAGNOSIS — H43813 Vitreous degeneration, bilateral: Secondary | ICD-10-CM | POA: Diagnosis not present

## 2022-07-27 ENCOUNTER — Ambulatory Visit (INDEPENDENT_AMBULATORY_CARE_PROVIDER_SITE_OTHER): Payer: Medicare HMO | Admitting: Family Medicine

## 2022-07-27 ENCOUNTER — Encounter: Payer: Self-pay | Admitting: Family Medicine

## 2022-07-27 ENCOUNTER — Encounter (HOSPITAL_BASED_OUTPATIENT_CLINIC_OR_DEPARTMENT_OTHER): Payer: Self-pay | Admitting: Obstetrics & Gynecology

## 2022-07-27 VITALS — BP 123/69 | HR 91 | Temp 97.2°F | Ht 68.0 in | Wt 148.8 lb

## 2022-07-27 DIAGNOSIS — R7303 Prediabetes: Secondary | ICD-10-CM

## 2022-07-27 NOTE — Patient Instructions (Signed)
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
## 2022-08-15 ENCOUNTER — Other Ambulatory Visit: Payer: Self-pay | Admitting: Obstetrics & Gynecology

## 2022-08-15 DIAGNOSIS — Z853 Personal history of malignant neoplasm of breast: Secondary | ICD-10-CM

## 2022-08-29 ENCOUNTER — Encounter: Payer: Medicare HMO | Attending: Family Medicine | Admitting: Nutrition

## 2022-08-29 ENCOUNTER — Encounter: Payer: Self-pay | Admitting: Nutrition

## 2022-08-29 VITALS — Ht 68.0 in | Wt 147.0 lb

## 2022-08-29 DIAGNOSIS — R7303 Prediabetes: Secondary | ICD-10-CM | POA: Insufficient documentation

## 2022-08-29 NOTE — Patient Instructions (Addendum)
Goals  Eat more whole plant based foods- foods from garden Cut out keto and utube . Don't need to checking blood sugars Focus on nutrient dense foods

## 2022-08-29 NOTE — Progress Notes (Signed)
Medical Nutrition Therapy  Appointment Start time:  0930  Appointment End time: 1030  Primary concerns today:  Pre- Dm , Elevated LDL Referral diagnosis: E73.03 Preferred learning style: NO PREFERECNCE  Learning readiness: Ready    NUTRITION ASSESSMENT  72 yr old wfemale here for pre-diabetes. She notes she was taken by surprise of her A1C being  5.7%.  A1C 5.3% last time it was checked. She notes she has been trying to follow KETO to help lower her blood sugars. Is losing weight and doesn't want to. Has been trying to eat more protein and less carbs. She notes she watches a lot of UTUBE videos of what foods to eat-unfortunately has been watching more keto and anti carb information recently. .  She is willing to focus on lifestyle medicine to improve her health, gain some weight back and improve getting a well balanced whole food plant based lifestyle.  She agrees that eating whole plant based foods with some chicken and fish will be healthier than eating a KETO lifestyle.  Anthropometrics  Wt Readings from Last 3 Encounters:  08/29/22 147 lb (66.7 kg)  07/27/22 148 lb 12.8 oz (67.5 kg)  07/07/22 153 lb 9.6 oz (69.7 kg)   Ht Readings from Last 3 Encounters:  08/29/22 5\' 8"  (1.727 m)  07/27/22 5\' 8"  (1.727 m)  07/07/22 5\' 8"  (1.727 m)   Body mass index is 22.35 kg/m. @BMIFA @ Facility age limit for growth %iles is 20 years. Facility age limit for growth %iles is 20 years.   Clinical Medical Hx:  Medications:  Labs:  Lab Results  Component Value Date   HGBA1C 5.7 (H) 07/07/2022      Latest Ref Rng & Units 07/07/2022    9:07 AM 07/02/2021    9:56 AM 06/29/2020   11:03 AM  CMP  Glucose 70 - 99 mg/dL 91  85  94   BUN 8 - 27 mg/dL 18  16  14    Creatinine 0.57 - 1.00 mg/dL 2.95  6.21  3.08   Sodium 134 - 144 mmol/L 137  138  139   Potassium 3.5 - 5.2 mmol/L 4.4  4.7  3.6   Chloride 96 - 106 mmol/L 103  98  104   CO2 20 - 29 mmol/L 18  21  27    Calcium 8.7 - 10.3 mg/dL 8.9   9.2  9.3   Total Protein 6.0 - 8.5 g/dL 6.2  6.6  6.8   Total Bilirubin 0.0 - 1.2 mg/dL 0.4  0.5  0.7   Alkaline Phos 44 - 121 IU/L 78  74  56   AST 0 - 40 IU/L 21  22  20    ALT 0 - 32 IU/L 16  20  18     Lipid Panel     Component Value Date/Time   CHOL 177 07/07/2022 0907   TRIG 63 07/07/2022 0907   HDL 62 07/07/2022 0907   CHOLHDL 2.9 07/07/2022 0907   CHOLHDL 2.9 06/29/2020 1103   VLDL 14 06/29/2020 1103   LDLCALC 103 (H) 07/07/2022 0907   LABVLDL 12 07/07/2022 0907    Notable Signs/Symptoms: none  Lifestyle & Dietary Hx Widowed  Estimated daily fluid intake: 60 oz Supplements: Multiple OTC-- VIt C, Golbi berris, Co Enzyme Q, garlic,  Sleep: poor Stress / self-care: worried about having diabetes Current average weekly physical activity: walks and active  24-Hr Dietary Recall First Meal: oatmeal, nuts, greek yogurt, fruit, Snack:  Second Meal: vegetables, beans, water, keto bread  Snack:  Third Meal: salad, broccoli, 1 slice low carb bread, water Snack: nuts Beverages: water  Estimated Energy Needs Calories: 1600 Carbohydrate: 180g Protein: 120g Fat: 44g   NUTRITION DIAGNOSIS  NB-1.1 Food and nutrition-related knowledge deficit As related to Following keto diet low in calories .  As evidenced by A1C 5.7% and LDL 103.Marland Kitchen   NUTRITION INTERVENTION  Nutrition education (E-1) on the following topics:  Nutrition and Diabetes education provided on My Plate, CHO counting, meal planning, portion sizes, timing of meals, avoiding snacks between meals unless having a low blood sugar, target ranges for A1C and blood sugars, signs/symptoms and treatment of hyper/hypoglycemia, monitoring blood sugars, taking medications as prescribed, benefits of exercising 30 minutes per day and prevention of complications of DM.  Dangers of a KETO and how it can increase blood sugars instead of lower them.   Handouts Provided Include  Lifestyle Medicine packet   Learning Style &  Readiness for Change Teaching method utilized: Visual & Auditory  Demonstrated degree of understanding via: Teach Back  Barriers to learning/adherence to lifestyle change: None  Goals Established by Pt Goals  Eat more whole plant based foods- foods from garden Cut out keto and utube . Don't need to checking blood sugars Focus on nutrient dense foods   MONITORING & EVALUATION Dietary intake, weekly physical activity, and weight in 1 month.  Next Steps  Patient is to work on eating more whole plant based meals.

## 2022-10-04 ENCOUNTER — Ambulatory Visit
Admission: RE | Admit: 2022-10-04 | Discharge: 2022-10-04 | Disposition: A | Discharge status: Home / Self Care | Payer: Medicare HMO | Source: Ambulatory Visit | Attending: Obstetrics & Gynecology | Admitting: Obstetrics & Gynecology

## 2022-10-04 DIAGNOSIS — Z853 Personal history of malignant neoplasm of breast: Secondary | ICD-10-CM | POA: Diagnosis not present

## 2022-10-20 ENCOUNTER — Telehealth: Payer: Self-pay | Admitting: Family Medicine

## 2022-11-01 ENCOUNTER — Ambulatory Visit: Payer: Medicare HMO | Admitting: Family Medicine

## 2022-11-23 ENCOUNTER — Encounter: Payer: Self-pay | Admitting: Family Medicine

## 2022-11-23 ENCOUNTER — Ambulatory Visit (INDEPENDENT_AMBULATORY_CARE_PROVIDER_SITE_OTHER): Payer: Medicare HMO | Admitting: Family Medicine

## 2022-11-23 VITALS — BP 138/74 | HR 71 | Temp 98.2°F | Ht 68.0 in | Wt 147.0 lb

## 2022-11-23 DIAGNOSIS — T148XXA Other injury of unspecified body region, initial encounter: Secondary | ICD-10-CM

## 2022-11-23 DIAGNOSIS — R7303 Prediabetes: Secondary | ICD-10-CM | POA: Diagnosis not present

## 2022-11-23 DIAGNOSIS — S1086XA Insect bite of other specified part of neck, initial encounter: Secondary | ICD-10-CM

## 2022-11-23 LAB — BAYER DCA HB A1C WAIVED: HB A1C (BAYER DCA - WAIVED): 4.9 % (ref 4.8–5.6)

## 2022-11-23 NOTE — Patient Instructions (Signed)
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
## 2022-11-30 ENCOUNTER — Encounter: Payer: Medicare HMO | Attending: Family Medicine | Admitting: Nutrition

## 2022-11-30 VITALS — Ht 68.0 in | Wt 149.0 lb

## 2022-11-30 DIAGNOSIS — R7303 Prediabetes: Secondary | ICD-10-CM | POA: Insufficient documentation

## 2022-11-30 NOTE — Patient Instructions (Signed)
Keep up the great job  

## 2022-11-30 NOTE — Progress Notes (Signed)
Medical Nutrition Therapy  Appointment Start time:  1030 Appointment End time: 1100  Primary concerns today:  Pre- Dm , Elevated LDL Referral diagnosis: E73.03 Preferred learning style: NO PREFERECNCE  Learning readiness: Ready    NUTRITION ASSESSMENT  72 yr old wfemale here for pre-diabetes follow up. She has done fantastic.  A1C down to  4.9% from 5. 7% from her MD visit.    Changes made:Feeling much better and a lot more energy. She is eating mostly all plant based foods.  Has been walking daily 2 miles. Started going to weight strength training 1-2 times per week. Drinking more water. Eating more whole plant based foods. Planning and preparing foods at home.   Goals set previous visit: Eat more whole plant based foods- foods from garden-done Cut out keto and utube -done  Don't need to checking blood sugars-done Focus on nutrient dense foods-done   Anthropometrics  Wt Readings from Last 3 Encounters:  11/23/22 147 lb (66.7 kg)  08/29/22 147 lb (66.7 kg)  07/27/22 148 lb 12.8 oz (67.5 kg)   Ht Readings from Last 3 Encounters:  11/23/22 5\' 8"  (1.727 m)  08/29/22 5\' 8"  (1.727 m)  07/27/22 5\' 8"  (1.727 m)   There is no height or weight on file to calculate BMI. @BMIFA @ Facility age limit for growth %iles is 20 years. Facility age limit for growth %iles is 20 years.   Clinical Medical Hx: see chart Medications:  Labs:  Lab Results  Component Value Date   HGBA1C 4.9 11/23/2022      Latest Ref Rng & Units 07/07/2022    9:07 AM 07/02/2021    9:56 AM 06/29/2020   11:03 AM  CMP  Glucose 70 - 99 mg/dL 91  85  94   BUN 8 - 27 mg/dL 18  16  14    Creatinine 0.57 - 1.00 mg/dL 6.29  5.28  4.13   Sodium 134 - 144 mmol/L 137  138  139   Potassium 3.5 - 5.2 mmol/L 4.4  4.7  3.6   Chloride 96 - 106 mmol/L 103  98  104   CO2 20 - 29 mmol/L 18  21  27    Calcium 8.7 - 10.3 mg/dL 8.9  9.2  9.3   Total Protein 6.0 - 8.5 g/dL 6.2  6.6  6.8   Total Bilirubin 0.0 - 1.2 mg/dL 0.4   0.5  0.7   Alkaline Phos 44 - 121 IU/L 78  74  56   AST 0 - 40 IU/L 21  22  20    ALT 0 - 32 IU/L 16  20  18     Lipid Panel     Component Value Date/Time   CHOL 177 07/07/2022 0907   TRIG 63 07/07/2022 0907   HDL 62 07/07/2022 0907   CHOLHDL 2.9 07/07/2022 0907   CHOLHDL 2.9 06/29/2020 1103   VLDL 14 06/29/2020 1103   LDLCALC 103 (H) 07/07/2022 0907   LABVLDL 12 07/07/2022 0907    Notable Signs/Symptoms: none  Lifestyle & Dietary Hx Widowed  Estimated daily fluid intake: 60 oz Supplements: Multiple OTC-- VIt C, Golbi berris, Co Enzyme Q, garlic,  Sleep: poor Stress / self-care: worried about having diabetes Current average weekly physical activity: walks and active  24-Hr Dietary Recall First Meal: oatmeal, nuts, greek yogurt, fruit, Snack:  Second Meal: vegetables, beans, water,whole grain bread Snack:  Third Meal: salad, broccoli, Ezekiel bread, beans, fruit, water Snack: nuts Beverages: water  Estimated Energy Needs Calories: 1600  Carbohydrate: 180g Protein: 120g Fat: 44g   NUTRITION DIAGNOSIS  NB-1.1 Food and nutrition-related knowledge deficit As related to Following keto diet low in calories .  As evidenced by A1C 5.7% and LDL 103.Marland Kitchen   NUTRITION INTERVENTION  Nutrition education (E-1) on the following topics:  Lifestyle Medicine  - Whole Food, Plant Predominant Nutrition is highly recommended: Eat Plenty of vegetables, Mushrooms, fruits, Legumes, Whole Grains, Nuts, seeds in lieu of processed meats, processed snacks/pastries red meat, poultry, eggs.    -It is better to avoid simple carbohydrates including: Cakes, Sweet Desserts, Ice Cream, Soda (diet and regular), Sweet Tea, Candies, Chips, Cookies, Store Bought Juices, Alcohol in Excess of  1-2 drinks a day, Lemonade,  Artificial Sweeteners, Doughnuts, Coffee Creamers, "Sugar-free" Products, etc, etc.  This is not a complete list.....  Exercise: If you are able: 30 -60 minutes a day ,4 days a week, or 150  minutes a week.  The longer the better.  Combine stretch, strength, and aerobic activities.  If you were told in the past that you have high risk for cardiovascular diseases, you may seek evaluation by your heart doctor prior to initiating moderate to intense exercise programs.    Handouts Provided Include  Lifestyle Medicine packet   Learning Style & Readiness for Change Teaching method utilized: Visual & Auditory  Demonstrated degree of understanding via: Teach Back  Barriers to learning/adherence to lifestyle change: None  Goals Established by Pt Goals Keep up the great job!!   MONITORING & EVALUATION Dietary intake, weekly physical activity, and weight PRN  Next Steps  Patient is to keep following a whole plant based diet.

## 2022-12-02 DIAGNOSIS — L650 Telogen effluvium: Secondary | ICD-10-CM | POA: Diagnosis not present

## 2022-12-02 DIAGNOSIS — L309 Dermatitis, unspecified: Secondary | ICD-10-CM | POA: Diagnosis not present

## 2022-12-02 DIAGNOSIS — D692 Other nonthrombocytopenic purpura: Secondary | ICD-10-CM | POA: Diagnosis not present

## 2022-12-02 DIAGNOSIS — D2261 Melanocytic nevi of right upper limb, including shoulder: Secondary | ICD-10-CM | POA: Diagnosis not present

## 2022-12-02 DIAGNOSIS — D1801 Hemangioma of skin and subcutaneous tissue: Secondary | ICD-10-CM | POA: Diagnosis not present

## 2022-12-02 DIAGNOSIS — L821 Other seborrheic keratosis: Secondary | ICD-10-CM | POA: Diagnosis not present

## 2022-12-02 DIAGNOSIS — L649 Androgenic alopecia, unspecified: Secondary | ICD-10-CM | POA: Diagnosis not present

## 2022-12-02 DIAGNOSIS — Z419 Encounter for procedure for purposes other than remedying health state, unspecified: Secondary | ICD-10-CM | POA: Diagnosis not present

## 2022-12-02 DIAGNOSIS — D225 Melanocytic nevi of trunk: Secondary | ICD-10-CM | POA: Diagnosis not present

## 2022-12-02 DIAGNOSIS — Z85828 Personal history of other malignant neoplasm of skin: Secondary | ICD-10-CM | POA: Diagnosis not present

## 2023-02-22 DIAGNOSIS — L603 Nail dystrophy: Secondary | ICD-10-CM | POA: Diagnosis not present

## 2023-02-22 DIAGNOSIS — Z85828 Personal history of other malignant neoplasm of skin: Secondary | ICD-10-CM | POA: Diagnosis not present

## 2023-02-22 DIAGNOSIS — L821 Other seborrheic keratosis: Secondary | ICD-10-CM | POA: Diagnosis not present

## 2023-07-21 ENCOUNTER — Ambulatory Visit (HOSPITAL_BASED_OUTPATIENT_CLINIC_OR_DEPARTMENT_OTHER): Payer: Medicare HMO | Admitting: Obstetrics & Gynecology

## 2023-07-21 ENCOUNTER — Encounter (HOSPITAL_BASED_OUTPATIENT_CLINIC_OR_DEPARTMENT_OTHER): Payer: Self-pay | Admitting: Obstetrics & Gynecology

## 2023-07-21 ENCOUNTER — Other Ambulatory Visit (HOSPITAL_COMMUNITY)
Admission: RE | Admit: 2023-07-21 | Discharge: 2023-07-21 | Disposition: A | Discharge status: Home / Self Care | Source: Ambulatory Visit | Attending: Obstetrics & Gynecology | Admitting: Obstetrics & Gynecology

## 2023-07-21 VITALS — BP 132/70 | HR 65 | Ht 68.0 in | Wt 146.6 lb

## 2023-07-21 DIAGNOSIS — Z9189 Other specified personal risk factors, not elsewhere classified: Secondary | ICD-10-CM

## 2023-07-21 DIAGNOSIS — E7800 Pure hypercholesterolemia, unspecified: Secondary | ICD-10-CM

## 2023-07-21 DIAGNOSIS — E673 Hypervitaminosis D: Secondary | ICD-10-CM

## 2023-07-21 DIAGNOSIS — Z803 Family history of malignant neoplasm of breast: Secondary | ICD-10-CM

## 2023-07-21 DIAGNOSIS — R634 Abnormal weight loss: Secondary | ICD-10-CM

## 2023-07-21 DIAGNOSIS — R7303 Prediabetes: Secondary | ICD-10-CM

## 2023-07-21 DIAGNOSIS — Z124 Encounter for screening for malignant neoplasm of cervix: Secondary | ICD-10-CM

## 2023-07-21 DIAGNOSIS — D0511 Intraductal carcinoma in situ of right breast: Secondary | ICD-10-CM

## 2023-07-21 NOTE — Progress Notes (Signed)
 Breast and Pelvic exam Patient name: Tiffany Dillon MRN [id number]  Date of birth: 09/25/1950 Chief Complaint:   Breast and Pelvic Exam  History of Present Illness:   Tiffany Dillon is a 73 y.o. G18P0101 Caucasian female being seen today for breast and pelvic exam.  Personal hx of breast cancer.  Denies vaginal bleeding.  Does have some urinary urgency.  Doesn't leak urine, only with sneezing.    Patient's last menstrual period was 04/25/1998.  Last pap 06/2019. Results were:  negative . H/O abnormal pap: no Last mammogram: 10/04/2022. Results were: normal. H/o DCIS. Last colonoscopy: 2019. Results were: normal. Family h/o colorectal cancer: no DEXA:  2016.  Declines treatment and doesn't want treatment even if there was osteoporosis     07/21/2023    8:47 AM 11/23/2022   10:09 AM 08/29/2022    2:26 PM 07/27/2022    9:03 AM 07/07/2022    8:19 AM  Depression screen PHQ 2/9  Decreased Interest 0 0 0 0 0  Down, Depressed, Hopeless 0 0 0 0 0  PHQ - 2 Score 0 0 0 0 0  Altered sleeping  1  1   Tired, decreased energy  0  0   Change in appetite  0  0   Feeling bad or failure about yourself   0  0   Trouble concentrating  0  0   Moving slowly or fidgety/restless  0  0   Suicidal thoughts  0  0   PHQ-9 Score  1  1   Difficult doing work/chores  Not difficult at all  Not difficult at all     Review of Systems:   Pertinent items are noted in HPI  Denies any vaginal bleeding or vaginal discharge, or pelvic.  Denies bowel changes.   Pertinent History Reviewed:  Reviewed past medical,surgical, social and family history.  Reviewed problem list, medications and allergies. Physical Assessment:   Vitals:   07/21/23 0843 07/21/23 0913  BP: (!) 147/81 132/70  Pulse: 65   Weight: 146 lb 9.6 oz (66.5 kg)   Height: 5\' 8"  (1.727 m)   Body mass index is 22.29 kg/m.        Physical Examination:   General appearance - well appearing, and in no distress  Mental status - alert,  oriented to person, place, and time  Psych:  She has a normal mood and affect  Skin - warm and dry, normal color, no suspicious lesions noted  Chest - effort normal, all lung fields clear to auscultation bilaterally  Heart - normal rate and regular rhythm  Neck:  midline trachea, no thyromegaly or nodules  Breasts - breasts appear normal, no suspicious masses, no skin or nipple changes or  axillary nodes, radiation changes around right areola (stable)  Abdomen - soft, nontender, nondistended, no masses or organomegaly  Pelvic - VULVA: normal appearing vulva with no masses, tenderness or lesions    VAGINA: normal appearing vagina with normal color and discharge, no lesions    CERVIX: normal appearing cervix without discharge or lesions, no CMT  Thin prep pap is done.    UTERUS: uterus is felt to be normal size, shape, consistency and nontender   ADNEXA: No adnexal masses or tenderness noted.  Rectal - normal rectal, good sphincter tone, no masses felt.   Extremities:  No swelling or varicosities noted  Chaperone present for exam  Assessment & Plan:  1. GYN exam for high-risk Medicare patient (Primary) - Pap  smear obtained today - Mammogram up to date - Colonoscopy 2019, follow up 10 years - Bone mineral density declined - lab work ordered today - vaccines reviewed/updated.  Tdap is due but pt declines today.  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 Family history of breast cancer  4. Cervical cancer screening - Cytology - PAP( [facility])  5. Vitamin D intoxication - VITAMIN D 25 Hydroxy (Vit-D Deficiency, Fractures)  6. Elevated LDL cholesterol level - Lipid panel - Comprehensive metabolic panel with GFR  7. Prediabetes - Hemoglobin A1c  8. Weight loss - TSH   Follow-up: Return in about 1 year (around 07/20/2024).  Jerene Bears, MD 07/21/2023 9:17 AM

## 2023-07-22 LAB — COMPREHENSIVE METABOLIC PANEL WITH GFR
ALT: 19 IU/L (ref 0–32)
AST: 22 IU/L (ref 0–40)
Albumin: 4.3 g/dL (ref 3.8–4.8)
Alkaline Phosphatase: 76 IU/L (ref 44–121)
BUN/Creatinine Ratio: 28 (ref 12–28)
BUN: 22 mg/dL (ref 8–27)
Bilirubin Total: 0.5 mg/dL (ref 0.0–1.2)
CO2: 22 mmol/L (ref 20–29)
Calcium: 9 mg/dL (ref 8.7–10.3)
Chloride: 101 mmol/L (ref 96–106)
Creatinine, Ser: 0.8 mg/dL (ref 0.57–1.00)
Globulin, Total: 2.2 g/dL (ref 1.5–4.5)
Glucose: 86 mg/dL (ref 70–99)
Potassium: 4.4 mmol/L (ref 3.5–5.2)
Sodium: 139 mmol/L (ref 134–144)
Total Protein: 6.5 g/dL (ref 6.0–8.5)
eGFR: 77 mL/min/{1.73_m2} (ref >59)

## 2023-07-22 LAB — LIPID PANEL
Chol/HDL Ratio: 2.8 ratio (ref 0.0–4.4)
Cholesterol, Total: 192 mg/dL (ref 100–199)
HDL: 68 mg/dL (ref >39)
LDL Chol Calc (NIH): 110 mg/dL — ABNORMAL HIGH (ref 0–99)
Triglycerides: 74 mg/dL (ref 0–149)
VLDL Cholesterol Cal: 14 mg/dL (ref 5–40)

## 2023-07-22 LAB — HEMOGLOBIN A1C
Est. average glucose Bld gHb Est-mCnc: 97 mg/dL
Hgb A1c MFr Bld: 5 % (ref 4.8–5.6)

## 2023-07-22 LAB — VITAMIN D 25 HYDROXY (VIT D DEFICIENCY, FRACTURES): Vit D, 25-Hydroxy: 55.1 ng/mL (ref 30.0–100.0)

## 2023-07-22 LAB — TSH: TSH: 5.09 u[IU]/mL — ABNORMAL HIGH (ref 0.450–4.500)

## 2023-07-24 ENCOUNTER — Encounter (HOSPITAL_BASED_OUTPATIENT_CLINIC_OR_DEPARTMENT_OTHER): Payer: Self-pay | Admitting: Obstetrics & Gynecology

## 2023-07-24 LAB — CYTOLOGY - PAP: Diagnosis: NEGATIVE

## 2023-07-27 DIAGNOSIS — H35373 Puckering of macula, bilateral: Secondary | ICD-10-CM | POA: Diagnosis not present

## 2023-07-27 DIAGNOSIS — H2513 Age-related nuclear cataract, bilateral: Secondary | ICD-10-CM | POA: Diagnosis not present

## 2023-07-27 DIAGNOSIS — H25012 Cortical age-related cataract, left eye: Secondary | ICD-10-CM | POA: Diagnosis not present

## 2023-07-27 DIAGNOSIS — H524 Presbyopia: Secondary | ICD-10-CM | POA: Diagnosis not present

## 2023-07-27 DIAGNOSIS — H18593 Other hereditary corneal dystrophies, bilateral: Secondary | ICD-10-CM | POA: Diagnosis not present

## 2023-07-27 DIAGNOSIS — H5213 Myopia, bilateral: Secondary | ICD-10-CM | POA: Diagnosis not present

## 2023-08-01 LAB — T4, FREE: Free T4: 1.15 ng/dL (ref 0.82–1.77)

## 2023-08-01 LAB — SPECIMEN STATUS REPORT

## 2023-08-22 ENCOUNTER — Other Ambulatory Visit: Payer: Self-pay | Admitting: Obstetrics & Gynecology

## 2023-08-22 DIAGNOSIS — Z9889 Other specified postprocedural states: Secondary | ICD-10-CM

## 2023-10-05 ENCOUNTER — Ambulatory Visit
Admission: RE | Admit: 2023-10-05 | Discharge: 2023-10-05 | Disposition: A | Discharge status: Home / Self Care | Source: Ambulatory Visit | Attending: Obstetrics & Gynecology | Admitting: Obstetrics & Gynecology

## 2023-10-05 DIAGNOSIS — Z9889 Other specified postprocedural states: Secondary | ICD-10-CM

## 2023-10-05 DIAGNOSIS — Z853 Personal history of malignant neoplasm of breast: Secondary | ICD-10-CM | POA: Diagnosis not present

## 2023-10-05 DIAGNOSIS — Z08 Encounter for follow-up examination after completed treatment for malignant neoplasm: Secondary | ICD-10-CM | POA: Diagnosis not present

## 2023-11-07 ENCOUNTER — Ambulatory Visit: Admitting: Family Medicine

## 2023-11-07 ENCOUNTER — Encounter: Payer: Self-pay | Admitting: Family Medicine

## 2023-11-07 VITALS — BP 145/83 | HR 78 | Temp 96.9°F | Ht 68.0 in | Wt 146.2 lb

## 2023-11-07 DIAGNOSIS — R7989 Other specified abnormal findings of blood chemistry: Secondary | ICD-10-CM | POA: Diagnosis not present

## 2023-11-07 DIAGNOSIS — K429 Umbilical hernia without obstruction or gangrene: Secondary | ICD-10-CM | POA: Diagnosis not present

## 2023-11-07 DIAGNOSIS — L659 Nonscarring hair loss, unspecified: Secondary | ICD-10-CM | POA: Diagnosis not present
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
## 2023-11-10 ENCOUNTER — Ambulatory Visit: Payer: Self-pay | Admitting: Family Medicine

## 2023-11-10 LAB — ANTI-TPO AB (RDL): Anti-TPO Ab (RDL): <9 [IU]/mL (ref <9.0)

## 2023-11-11 LAB — ANEMIA PROFILE B
Basophils Absolute: 0.1 x10E3/uL (ref 0.0–0.2)
Basos: 1 %
EOS (ABSOLUTE): 0.1 x10E3/uL (ref 0.0–0.4)
Eos: 2 %
Ferritin: 50 ng/mL (ref 15–150)
Folate: >20 ng/mL (ref >3.0)
Hematocrit: 44.8 % (ref 34.0–46.6)
Hemoglobin: 14 g/dL (ref 11.1–15.9)
Immature Grans (Abs): 0 x10E3/uL (ref 0.0–0.1)
Immature Granulocytes: 0 %
Iron Saturation: 25 % (ref 15–55)
Iron: 96 ug/dL (ref 27–139)
Lymphocytes Absolute: 1.3 x10E3/uL (ref 0.7–3.1)
Lymphs: 28 %
MCH: 31.1 pg (ref 26.6–33.0)
MCHC: 31.3 g/dL — ABNORMAL LOW (ref 31.5–35.7)
MCV: 100 fL — ABNORMAL HIGH (ref 79–97)
Monocytes Absolute: 0.3 x10E3/uL (ref 0.1–0.9)
Monocytes: 6 %
Neutrophils Absolute: 2.9 x10E3/uL (ref 1.4–7.0)
Neutrophils: 63 %
Platelets: 208 x10E3/uL (ref 150–450)
RBC: 4.5 x10E6/uL (ref 3.77–5.28)
RDW: 12.2 % (ref 11.7–15.4)
Retic Ct Pct: 1.6 % (ref 0.6–2.6)
Total Iron Binding Capacity: 379 ug/dL (ref 250–450)
UIBC: 283 ug/dL (ref 118–369)
Vitamin B-12: 1388 pg/mL — ABNORMAL HIGH (ref 232–1245)
WBC: 4.5 x10E3/uL (ref 3.4–10.8)

## 2023-11-11 LAB — DHEA: Dehydroepiandrosterone (DHEA): 224 ng/dL (ref 21–402)

## 2023-11-11 LAB — T3, FREE: T3, Free: 2.3 pg/mL (ref 2.0–4.4)

## 2023-11-11 LAB — THYROID PANEL WITH TSH
Free Thyroxine Index: 1.8 (ref 1.2–4.9)
T3 Uptake Ratio: 27 % (ref 24–39)
T4, Total: 6.7 ug/dL (ref 4.5–12.0)
TSH: 3.22 u[IU]/mL (ref 0.450–4.500)

## 2023-11-16 ENCOUNTER — Ambulatory Visit (HOSPITAL_COMMUNITY)
Admission: RE | Admit: 2023-11-16 | Discharge: 2023-11-16 | Disposition: A | Discharge status: Home / Self Care | Source: Ambulatory Visit | Attending: Family Medicine | Admitting: Family Medicine

## 2023-11-16 DIAGNOSIS — K429 Umbilical hernia without obstruction or gangrene: Secondary | ICD-10-CM | POA: Insufficient documentation

## 2023-11-16 DIAGNOSIS — R1905 Periumbilic swelling, mass or lump: Secondary | ICD-10-CM | POA: Diagnosis not present

## 2023-11-28 ENCOUNTER — Telehealth: Payer: Self-pay

## 2023-11-28 NOTE — Telephone Encounter (Signed)
 Patient aware of results and wanted to know if Rosaline thought it was okay for her to keep doing her kettlebell swinging exercises or will it make her hernia worse?

## 2023-11-28 NOTE — Telephone Encounter (Signed)
 Copied from CRM [id number]. Topic: Clinical - Lab/Test Results >> Nov 27, 2023  4:22 PM DeAngela L wrote: Reason for CRM: patient calling for imaging results on 11/16/23 and she did see the message in MyChart (patient seen it was a hernia) but would like to ask additional questions about imaging results   Pt num 1st try [phone] (H) (the patient states she gets a lot spam calls so she will pick up but she waits for a person to speak 1st) [phone] (M) try 2nd

## 2024-02-27 DIAGNOSIS — D225 Melanocytic nevi of trunk: Secondary | ICD-10-CM | POA: Diagnosis not present

## 2024-02-27 DIAGNOSIS — Z85828 Personal history of other malignant neoplasm of skin: Secondary | ICD-10-CM | POA: Diagnosis not present

## 2024-02-27 DIAGNOSIS — D1801 Hemangioma of skin and subcutaneous tissue: Secondary | ICD-10-CM | POA: Diagnosis not present

## 2024-02-27 DIAGNOSIS — D2261 Melanocytic nevi of right upper limb, including shoulder: Secondary | ICD-10-CM | POA: Diagnosis not present

## 2024-02-27 DIAGNOSIS — L821 Other seborrheic keratosis: Secondary | ICD-10-CM | POA: Diagnosis not present

## 2024-02-27 DIAGNOSIS — D2262 Melanocytic nevi of left upper limb, including shoulder: Secondary | ICD-10-CM | POA: Diagnosis not present

## 2024-02-27 DIAGNOSIS — L820 Inflamed seborrheic keratosis: Secondary | ICD-10-CM | POA: Diagnosis not present
